# Patient Record
Sex: Male | Born: 1975
Health system: Southern US, Community
[De-identification: ages and names within clinical notes are randomized; demographics above are authoritative.]

## PROBLEM LIST (undated history)

## (undated) DIAGNOSIS — E119 Type 2 diabetes mellitus without complications: Secondary | ICD-10-CM

## (undated) DIAGNOSIS — Z794 Long term (current) use of insulin: Secondary | ICD-10-CM

## (undated) DIAGNOSIS — L97519 Non-pressure chronic ulcer of other part of right foot with unspecified severity: Secondary | ICD-10-CM

## (undated) DIAGNOSIS — E11621 Type 2 diabetes mellitus with foot ulcer: Secondary | ICD-10-CM

## (undated) DIAGNOSIS — D649 Anemia, unspecified: Secondary | ICD-10-CM

## (undated) DIAGNOSIS — I10 Essential (primary) hypertension: Secondary | ICD-10-CM

---
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
## 2017-12-30 HISTORY — PX: FINGER SURGERY: SHX640

## 2018-02-11 DIAGNOSIS — Z6830 Body mass index (BMI) 30.0-30.9, adult: Secondary | ICD-10-CM | POA: Diagnosis not present

## 2018-02-11 DIAGNOSIS — E1165 Type 2 diabetes mellitus with hyperglycemia: Secondary | ICD-10-CM | POA: Diagnosis not present

## 2018-02-11 DIAGNOSIS — E1129 Type 2 diabetes mellitus with other diabetic kidney complication: Secondary | ICD-10-CM | POA: Diagnosis not present

## 2018-02-16 DIAGNOSIS — K580 Irritable bowel syndrome with diarrhea: Secondary | ICD-10-CM | POA: Diagnosis not present

## 2018-02-16 DIAGNOSIS — Z6830 Body mass index (BMI) 30.0-30.9, adult: Secondary | ICD-10-CM | POA: Diagnosis not present

## 2018-03-30 DIAGNOSIS — Z7984 Long term (current) use of oral hypoglycemic drugs: Secondary | ICD-10-CM | POA: Diagnosis not present

## 2018-03-30 DIAGNOSIS — J209 Acute bronchitis, unspecified: Secondary | ICD-10-CM | POA: Diagnosis not present

## 2018-03-30 DIAGNOSIS — J181 Lobar pneumonia, unspecified organism: Secondary | ICD-10-CM | POA: Diagnosis not present

## 2018-03-30 DIAGNOSIS — R42 Dizziness and giddiness: Secondary | ICD-10-CM | POA: Diagnosis not present

## 2018-03-30 DIAGNOSIS — R0600 Dyspnea, unspecified: Secondary | ICD-10-CM | POA: Diagnosis not present

## 2018-03-30 DIAGNOSIS — R000 Tachycardia, unspecified: Secondary | ICD-10-CM | POA: Diagnosis not present

## 2018-03-30 DIAGNOSIS — E119 Type 2 diabetes mellitus without complications: Secondary | ICD-10-CM | POA: Diagnosis not present

## 2018-03-30 DIAGNOSIS — Z6829 Body mass index (BMI) 29.0-29.9, adult: Secondary | ICD-10-CM | POA: Diagnosis not present

## 2018-03-30 DIAGNOSIS — J9801 Acute bronchospasm: Secondary | ICD-10-CM | POA: Diagnosis not present

## 2018-03-30 DIAGNOSIS — J189 Pneumonia, unspecified organism: Secondary | ICD-10-CM | POA: Diagnosis not present

## 2018-04-03 DIAGNOSIS — Z6829 Body mass index (BMI) 29.0-29.9, adult: Secondary | ICD-10-CM | POA: Diagnosis not present

## 2018-04-03 DIAGNOSIS — K219 Gastro-esophageal reflux disease without esophagitis: Secondary | ICD-10-CM | POA: Diagnosis not present

## 2018-04-27 DIAGNOSIS — E1129 Type 2 diabetes mellitus with other diabetic kidney complication: Secondary | ICD-10-CM | POA: Diagnosis not present

## 2018-04-27 DIAGNOSIS — E785 Hyperlipidemia, unspecified: Secondary | ICD-10-CM | POA: Diagnosis not present

## 2018-04-27 DIAGNOSIS — E1165 Type 2 diabetes mellitus with hyperglycemia: Secondary | ICD-10-CM | POA: Diagnosis not present

## 2018-07-08 DIAGNOSIS — E669 Obesity, unspecified: Secondary | ICD-10-CM | POA: Diagnosis not present

## 2018-07-08 DIAGNOSIS — M7989 Other specified soft tissue disorders: Secondary | ICD-10-CM | POA: Diagnosis not present

## 2018-07-08 DIAGNOSIS — R600 Localized edema: Secondary | ICD-10-CM | POA: Diagnosis not present

## 2018-07-08 DIAGNOSIS — M79604 Pain in right leg: Secondary | ICD-10-CM | POA: Diagnosis not present

## 2018-07-08 DIAGNOSIS — M79661 Pain in right lower leg: Secondary | ICD-10-CM | POA: Diagnosis not present

## 2018-07-09 DIAGNOSIS — E1129 Type 2 diabetes mellitus with other diabetic kidney complication: Secondary | ICD-10-CM | POA: Diagnosis not present

## 2018-07-09 DIAGNOSIS — E1165 Type 2 diabetes mellitus with hyperglycemia: Secondary | ICD-10-CM | POA: Diagnosis not present

## 2018-07-09 DIAGNOSIS — L03119 Cellulitis of unspecified part of limb: Secondary | ICD-10-CM | POA: Diagnosis not present

## 2018-07-12 DIAGNOSIS — L0390 Cellulitis, unspecified: Secondary | ICD-10-CM | POA: Diagnosis not present

## 2018-07-12 DIAGNOSIS — S299XXA Unspecified injury of thorax, initial encounter: Secondary | ICD-10-CM | POA: Diagnosis not present

## 2018-07-12 DIAGNOSIS — E1165 Type 2 diabetes mellitus with hyperglycemia: Secondary | ICD-10-CM | POA: Diagnosis not present

## 2018-07-15 DIAGNOSIS — R600 Localized edema: Secondary | ICD-10-CM | POA: Diagnosis not present

## 2018-07-15 DIAGNOSIS — A46 Erysipelas: Secondary | ICD-10-CM | POA: Diagnosis not present

## 2018-07-15 DIAGNOSIS — E669 Obesity, unspecified: Secondary | ICD-10-CM | POA: Diagnosis not present

## 2018-07-15 DIAGNOSIS — L03119 Cellulitis of unspecified part of limb: Secondary | ICD-10-CM | POA: Diagnosis not present

## 2018-07-17 DIAGNOSIS — R234 Changes in skin texture: Secondary | ICD-10-CM | POA: Diagnosis not present

## 2018-07-17 DIAGNOSIS — L03119 Cellulitis of unspecified part of limb: Secondary | ICD-10-CM | POA: Diagnosis not present

## 2018-07-17 DIAGNOSIS — L03115 Cellulitis of right lower limb: Secondary | ICD-10-CM | POA: Diagnosis not present

## 2018-07-17 DIAGNOSIS — E119 Type 2 diabetes mellitus without complications: Secondary | ICD-10-CM | POA: Diagnosis not present

## 2018-07-17 DIAGNOSIS — L02415 Cutaneous abscess of right lower limb: Secondary | ICD-10-CM | POA: Diagnosis not present

## 2018-07-17 DIAGNOSIS — Z6829 Body mass index (BMI) 29.0-29.9, adult: Secondary | ICD-10-CM | POA: Diagnosis not present

## 2018-07-18 DIAGNOSIS — L02415 Cutaneous abscess of right lower limb: Secondary | ICD-10-CM | POA: Diagnosis not present

## 2018-07-18 DIAGNOSIS — L03115 Cellulitis of right lower limb: Secondary | ICD-10-CM | POA: Diagnosis not present

## 2018-07-18 DIAGNOSIS — E119 Type 2 diabetes mellitus without complications: Secondary | ICD-10-CM | POA: Diagnosis not present

## 2018-07-19 DIAGNOSIS — L02415 Cutaneous abscess of right lower limb: Secondary | ICD-10-CM | POA: Diagnosis not present

## 2018-07-19 DIAGNOSIS — E119 Type 2 diabetes mellitus without complications: Secondary | ICD-10-CM | POA: Diagnosis not present

## 2018-07-19 DIAGNOSIS — L03115 Cellulitis of right lower limb: Secondary | ICD-10-CM | POA: Diagnosis not present

## 2018-07-21 DIAGNOSIS — L03115 Cellulitis of right lower limb: Secondary | ICD-10-CM | POA: Diagnosis not present

## 2018-07-21 DIAGNOSIS — E11622 Type 2 diabetes mellitus with other skin ulcer: Secondary | ICD-10-CM | POA: Diagnosis not present

## 2018-07-21 DIAGNOSIS — L97212 Non-pressure chronic ulcer of right calf with fat layer exposed: Secondary | ICD-10-CM | POA: Diagnosis not present

## 2018-07-23 DIAGNOSIS — L03115 Cellulitis of right lower limb: Secondary | ICD-10-CM | POA: Insufficient documentation

## 2018-07-28 DIAGNOSIS — E11622 Type 2 diabetes mellitus with other skin ulcer: Secondary | ICD-10-CM | POA: Diagnosis not present

## 2018-07-28 DIAGNOSIS — L03115 Cellulitis of right lower limb: Secondary | ICD-10-CM | POA: Diagnosis not present

## 2018-07-28 DIAGNOSIS — L97212 Non-pressure chronic ulcer of right calf with fat layer exposed: Secondary | ICD-10-CM | POA: Diagnosis not present

## 2018-07-31 DIAGNOSIS — E1129 Type 2 diabetes mellitus with other diabetic kidney complication: Secondary | ICD-10-CM | POA: Diagnosis not present

## 2018-07-31 DIAGNOSIS — E785 Hyperlipidemia, unspecified: Secondary | ICD-10-CM | POA: Diagnosis not present

## 2018-08-04 DIAGNOSIS — E11622 Type 2 diabetes mellitus with other skin ulcer: Secondary | ICD-10-CM | POA: Diagnosis not present

## 2018-08-04 DIAGNOSIS — L97212 Non-pressure chronic ulcer of right calf with fat layer exposed: Secondary | ICD-10-CM | POA: Diagnosis not present

## 2018-08-04 DIAGNOSIS — E11621 Type 2 diabetes mellitus with foot ulcer: Secondary | ICD-10-CM | POA: Diagnosis not present

## 2018-08-04 DIAGNOSIS — L03115 Cellulitis of right lower limb: Secondary | ICD-10-CM | POA: Diagnosis not present

## 2018-08-07 DIAGNOSIS — L97919 Non-pressure chronic ulcer of unspecified part of right lower leg with unspecified severity: Secondary | ICD-10-CM | POA: Diagnosis not present

## 2018-08-07 DIAGNOSIS — E1129 Type 2 diabetes mellitus with other diabetic kidney complication: Secondary | ICD-10-CM | POA: Diagnosis not present

## 2018-08-07 DIAGNOSIS — E1165 Type 2 diabetes mellitus with hyperglycemia: Secondary | ICD-10-CM | POA: Diagnosis not present

## 2018-08-11 DIAGNOSIS — L97812 Non-pressure chronic ulcer of other part of right lower leg with fat layer exposed: Secondary | ICD-10-CM | POA: Diagnosis not present

## 2018-08-11 DIAGNOSIS — L97212 Non-pressure chronic ulcer of right calf with fat layer exposed: Secondary | ICD-10-CM | POA: Diagnosis not present

## 2018-08-11 DIAGNOSIS — E11622 Type 2 diabetes mellitus with other skin ulcer: Secondary | ICD-10-CM | POA: Diagnosis not present

## 2018-08-12 DIAGNOSIS — L03115 Cellulitis of right lower limb: Secondary | ICD-10-CM | POA: Diagnosis not present

## 2018-08-18 DIAGNOSIS — L97212 Non-pressure chronic ulcer of right calf with fat layer exposed: Secondary | ICD-10-CM | POA: Diagnosis not present

## 2018-08-18 DIAGNOSIS — E11622 Type 2 diabetes mellitus with other skin ulcer: Secondary | ICD-10-CM | POA: Diagnosis not present

## 2018-08-18 DIAGNOSIS — L97812 Non-pressure chronic ulcer of other part of right lower leg with fat layer exposed: Secondary | ICD-10-CM | POA: Diagnosis not present

## 2018-08-21 DIAGNOSIS — I70203 Unspecified atherosclerosis of native arteries of extremities, bilateral legs: Secondary | ICD-10-CM | POA: Diagnosis not present

## 2018-08-21 DIAGNOSIS — L97219 Non-pressure chronic ulcer of right calf with unspecified severity: Secondary | ICD-10-CM | POA: Diagnosis not present

## 2018-08-21 DIAGNOSIS — E11622 Type 2 diabetes mellitus with other skin ulcer: Secondary | ICD-10-CM | POA: Diagnosis not present

## 2018-08-21 DIAGNOSIS — L97919 Non-pressure chronic ulcer of unspecified part of right lower leg with unspecified severity: Secondary | ICD-10-CM | POA: Diagnosis not present

## 2018-09-01 DIAGNOSIS — L03115 Cellulitis of right lower limb: Secondary | ICD-10-CM | POA: Diagnosis not present

## 2018-09-01 DIAGNOSIS — L97212 Non-pressure chronic ulcer of right calf with fat layer exposed: Secondary | ICD-10-CM | POA: Diagnosis not present

## 2018-09-01 DIAGNOSIS — E11622 Type 2 diabetes mellitus with other skin ulcer: Secondary | ICD-10-CM | POA: Diagnosis not present

## 2018-10-30 DIAGNOSIS — E1129 Type 2 diabetes mellitus with other diabetic kidney complication: Secondary | ICD-10-CM | POA: Diagnosis not present

## 2018-10-30 DIAGNOSIS — E785 Hyperlipidemia, unspecified: Secondary | ICD-10-CM | POA: Diagnosis not present

## 2018-11-06 DIAGNOSIS — E1165 Type 2 diabetes mellitus with hyperglycemia: Secondary | ICD-10-CM | POA: Diagnosis not present

## 2018-11-06 DIAGNOSIS — E785 Hyperlipidemia, unspecified: Secondary | ICD-10-CM | POA: Diagnosis not present

## 2018-11-06 DIAGNOSIS — E1129 Type 2 diabetes mellitus with other diabetic kidney complication: Secondary | ICD-10-CM | POA: Diagnosis not present

## 2018-11-25 DIAGNOSIS — E139 Other specified diabetes mellitus without complications: Secondary | ICD-10-CM | POA: Diagnosis not present

## 2018-12-07 DIAGNOSIS — Z6829 Body mass index (BMI) 29.0-29.9, adult: Secondary | ICD-10-CM | POA: Diagnosis not present

## 2018-12-07 DIAGNOSIS — E1165 Type 2 diabetes mellitus with hyperglycemia: Secondary | ICD-10-CM | POA: Diagnosis not present

## 2018-12-07 DIAGNOSIS — E1129 Type 2 diabetes mellitus with other diabetic kidney complication: Secondary | ICD-10-CM | POA: Diagnosis not present

## 2019-02-03 DIAGNOSIS — J209 Acute bronchitis, unspecified: Secondary | ICD-10-CM | POA: Diagnosis not present

## 2019-02-03 DIAGNOSIS — J9801 Acute bronchospasm: Secondary | ICD-10-CM | POA: Diagnosis not present

## 2019-02-04 DIAGNOSIS — S60459A Superficial foreign body of unspecified finger, initial encounter: Secondary | ICD-10-CM | POA: Diagnosis not present

## 2019-02-04 DIAGNOSIS — J209 Acute bronchitis, unspecified: Secondary | ICD-10-CM | POA: Diagnosis not present

## 2019-02-04 DIAGNOSIS — J9801 Acute bronchospasm: Secondary | ICD-10-CM | POA: Diagnosis not present

## 2019-02-04 DIAGNOSIS — L089 Local infection of the skin and subcutaneous tissue, unspecified: Secondary | ICD-10-CM | POA: Diagnosis not present

## 2019-02-05 DIAGNOSIS — R0602 Shortness of breath: Secondary | ICD-10-CM | POA: Diagnosis not present

## 2019-02-05 DIAGNOSIS — Z6829 Body mass index (BMI) 29.0-29.9, adult: Secondary | ICD-10-CM | POA: Diagnosis not present

## 2019-02-05 DIAGNOSIS — M7989 Other specified soft tissue disorders: Secondary | ICD-10-CM | POA: Diagnosis not present

## 2019-02-12 DIAGNOSIS — S60459A Superficial foreign body of unspecified finger, initial encounter: Secondary | ICD-10-CM | POA: Diagnosis not present

## 2019-02-12 DIAGNOSIS — E1129 Type 2 diabetes mellitus with other diabetic kidney complication: Secondary | ICD-10-CM | POA: Diagnosis not present

## 2019-02-12 DIAGNOSIS — L089 Local infection of the skin and subcutaneous tissue, unspecified: Secondary | ICD-10-CM | POA: Diagnosis not present

## 2019-02-12 DIAGNOSIS — L02512 Cutaneous abscess of left hand: Secondary | ICD-10-CM | POA: Diagnosis not present

## 2019-02-23 DIAGNOSIS — L02512 Cutaneous abscess of left hand: Secondary | ICD-10-CM | POA: Diagnosis not present

## 2019-02-23 DIAGNOSIS — Z6828 Body mass index (BMI) 28.0-28.9, adult: Secondary | ICD-10-CM | POA: Diagnosis not present

## 2019-02-26 DIAGNOSIS — L02512 Cutaneous abscess of left hand: Secondary | ICD-10-CM | POA: Diagnosis not present

## 2019-03-01 DIAGNOSIS — E7800 Pure hypercholesterolemia, unspecified: Secondary | ICD-10-CM | POA: Diagnosis not present

## 2019-03-01 DIAGNOSIS — M65842 Other synovitis and tenosynovitis, left hand: Secondary | ICD-10-CM | POA: Diagnosis not present

## 2019-03-01 DIAGNOSIS — L02512 Cutaneous abscess of left hand: Secondary | ICD-10-CM | POA: Diagnosis not present

## 2019-03-01 DIAGNOSIS — E119 Type 2 diabetes mellitus without complications: Secondary | ICD-10-CM | POA: Diagnosis not present

## 2019-03-02 DIAGNOSIS — E1129 Type 2 diabetes mellitus with other diabetic kidney complication: Secondary | ICD-10-CM | POA: Diagnosis not present

## 2019-03-02 DIAGNOSIS — E785 Hyperlipidemia, unspecified: Secondary | ICD-10-CM | POA: Diagnosis not present

## 2019-03-08 DIAGNOSIS — E1129 Type 2 diabetes mellitus with other diabetic kidney complication: Secondary | ICD-10-CM | POA: Diagnosis not present

## 2019-03-08 DIAGNOSIS — E785 Hyperlipidemia, unspecified: Secondary | ICD-10-CM | POA: Diagnosis not present

## 2019-03-08 DIAGNOSIS — E1165 Type 2 diabetes mellitus with hyperglycemia: Secondary | ICD-10-CM | POA: Diagnosis not present

## 2019-04-05 DIAGNOSIS — E113593 Type 2 diabetes mellitus with proliferative diabetic retinopathy without macular edema, bilateral: Secondary | ICD-10-CM | POA: Diagnosis not present

## 2019-04-05 DIAGNOSIS — Z794 Long term (current) use of insulin: Secondary | ICD-10-CM | POA: Diagnosis not present

## 2020-10-15 DIAGNOSIS — E119 Type 2 diabetes mellitus without complications: Secondary | ICD-10-CM | POA: Insufficient documentation

## 2020-10-15 DIAGNOSIS — I10 Essential (primary) hypertension: Secondary | ICD-10-CM | POA: Insufficient documentation

## 2020-10-15 DIAGNOSIS — L0390 Cellulitis, unspecified: Secondary | ICD-10-CM | POA: Insufficient documentation

## 2020-10-30 DIAGNOSIS — I96 Gangrene, not elsewhere classified: Secondary | ICD-10-CM

## 2020-10-30 HISTORY — PX: TOE AMPUTATION: SHX809

## 2020-10-30 HISTORY — DX: Gangrene, not elsewhere classified: I96

## 2020-11-14 DIAGNOSIS — L02611 Cutaneous abscess of right foot: Secondary | ICD-10-CM | POA: Insufficient documentation

## 2020-11-14 DIAGNOSIS — I96 Gangrene, not elsewhere classified: Secondary | ICD-10-CM | POA: Insufficient documentation

## 2020-11-14 DIAGNOSIS — Z1159 Encounter for screening for other viral diseases: Secondary | ICD-10-CM | POA: Insufficient documentation

## 2020-11-14 DIAGNOSIS — L03031 Cellulitis of right toe: Secondary | ICD-10-CM | POA: Insufficient documentation

## 2020-11-22 DIAGNOSIS — M86171 Other acute osteomyelitis, right ankle and foot: Secondary | ICD-10-CM | POA: Insufficient documentation

## 2020-11-22 DIAGNOSIS — Z09 Encounter for follow-up examination after completed treatment for conditions other than malignant neoplasm: Secondary | ICD-10-CM | POA: Insufficient documentation

## 2020-12-04 ENCOUNTER — Other Ambulatory Visit: Payer: Self-pay

## 2020-12-04 ENCOUNTER — Ambulatory Visit: Payer: Commercial Managed Care - PPO | Admitting: Podiatry

## 2020-12-04 ENCOUNTER — Encounter: Payer: Self-pay | Admitting: Podiatry

## 2020-12-04 ENCOUNTER — Ambulatory Visit (INDEPENDENT_AMBULATORY_CARE_PROVIDER_SITE_OTHER): Payer: Commercial Managed Care - PPO

## 2020-12-04 ENCOUNTER — Encounter (HOSPITAL_COMMUNITY): Payer: Self-pay | Admitting: Internal Medicine

## 2020-12-04 ENCOUNTER — Other Ambulatory Visit: Payer: Self-pay | Admitting: *Deleted

## 2020-12-04 ENCOUNTER — Inpatient Hospital Stay (HOSPITAL_COMMUNITY)
Admission: AD | Admit: 2020-12-04 | Discharge: 2020-12-15 | DRG: 240 | Disposition: A | Discharge status: Home Health | Payer: Commercial Managed Care - PPO | Source: Ambulatory Visit | Attending: Internal Medicine | Admitting: Internal Medicine

## 2020-12-04 VITALS — BP 132/77 | HR 96 | Temp 98.1°F | Resp 16

## 2020-12-04 DIAGNOSIS — L0390 Cellulitis, unspecified: Secondary | ICD-10-CM | POA: Diagnosis not present

## 2020-12-04 DIAGNOSIS — Z9889 Other specified postprocedural states: Secondary | ICD-10-CM

## 2020-12-04 DIAGNOSIS — L97519 Non-pressure chronic ulcer of other part of right foot with unspecified severity: Secondary | ICD-10-CM | POA: Diagnosis present

## 2020-12-04 DIAGNOSIS — L03115 Cellulitis of right lower limb: Secondary | ICD-10-CM | POA: Diagnosis present

## 2020-12-04 DIAGNOSIS — D649 Anemia, unspecified: Secondary | ICD-10-CM | POA: Diagnosis not present

## 2020-12-04 DIAGNOSIS — Z7984 Long term (current) use of oral hypoglycemic drugs: Secondary | ICD-10-CM

## 2020-12-04 DIAGNOSIS — E1169 Type 2 diabetes mellitus with other specified complication: Secondary | ICD-10-CM | POA: Diagnosis present

## 2020-12-04 DIAGNOSIS — L97514 Non-pressure chronic ulcer of other part of right foot with necrosis of bone: Secondary | ICD-10-CM

## 2020-12-04 DIAGNOSIS — Z833 Family history of diabetes mellitus: Secondary | ICD-10-CM

## 2020-12-04 DIAGNOSIS — D638 Anemia in other chronic diseases classified elsewhere: Secondary | ICD-10-CM | POA: Diagnosis present

## 2020-12-04 DIAGNOSIS — I96 Gangrene, not elsewhere classified: Secondary | ICD-10-CM

## 2020-12-04 DIAGNOSIS — E11649 Type 2 diabetes mellitus with hypoglycemia without coma: Secondary | ICD-10-CM | POA: Diagnosis not present

## 2020-12-04 DIAGNOSIS — D509 Iron deficiency anemia, unspecified: Secondary | ICD-10-CM | POA: Diagnosis present

## 2020-12-04 DIAGNOSIS — E785 Hyperlipidemia, unspecified: Secondary | ICD-10-CM | POA: Diagnosis present

## 2020-12-04 DIAGNOSIS — E663 Overweight: Secondary | ICD-10-CM | POA: Diagnosis present

## 2020-12-04 DIAGNOSIS — B9620 Unspecified Escherichia coli [E. coli] as the cause of diseases classified elsewhere: Secondary | ICD-10-CM | POA: Diagnosis present

## 2020-12-04 DIAGNOSIS — E871 Hypo-osmolality and hyponatremia: Secondary | ICD-10-CM | POA: Diagnosis not present

## 2020-12-04 DIAGNOSIS — F17210 Nicotine dependence, cigarettes, uncomplicated: Secondary | ICD-10-CM | POA: Diagnosis present

## 2020-12-04 DIAGNOSIS — B3789 Other sites of candidiasis: Secondary | ICD-10-CM | POA: Diagnosis present

## 2020-12-04 DIAGNOSIS — G8918 Other acute postprocedural pain: Secondary | ICD-10-CM

## 2020-12-04 DIAGNOSIS — E11621 Type 2 diabetes mellitus with foot ulcer: Secondary | ICD-10-CM | POA: Diagnosis present

## 2020-12-04 DIAGNOSIS — D72825 Bandemia: Secondary | ICD-10-CM | POA: Diagnosis present

## 2020-12-04 DIAGNOSIS — E1152 Type 2 diabetes mellitus with diabetic peripheral angiopathy with gangrene: Principal | ICD-10-CM | POA: Diagnosis present

## 2020-12-04 DIAGNOSIS — F17200 Nicotine dependence, unspecified, uncomplicated: Secondary | ICD-10-CM | POA: Diagnosis not present

## 2020-12-04 DIAGNOSIS — K5900 Constipation, unspecified: Secondary | ICD-10-CM | POA: Diagnosis not present

## 2020-12-04 DIAGNOSIS — Z794 Long term (current) use of insulin: Secondary | ICD-10-CM

## 2020-12-04 DIAGNOSIS — Z20822 Contact with and (suspected) exposure to covid-19: Secondary | ICD-10-CM | POA: Diagnosis present

## 2020-12-04 DIAGNOSIS — E1165 Type 2 diabetes mellitus with hyperglycemia: Secondary | ICD-10-CM | POA: Diagnosis present

## 2020-12-04 DIAGNOSIS — Z79899 Other long term (current) drug therapy: Secondary | ICD-10-CM

## 2020-12-04 DIAGNOSIS — I739 Peripheral vascular disease, unspecified: Secondary | ICD-10-CM | POA: Diagnosis not present

## 2020-12-04 DIAGNOSIS — I998 Other disorder of circulatory system: Secondary | ICD-10-CM

## 2020-12-04 DIAGNOSIS — L02611 Cutaneous abscess of right foot: Secondary | ICD-10-CM | POA: Diagnosis present

## 2020-12-04 DIAGNOSIS — Z6827 Body mass index (BMI) 27.0-27.9, adult: Secondary | ICD-10-CM | POA: Diagnosis not present

## 2020-12-04 DIAGNOSIS — M86171 Other acute osteomyelitis, right ankle and foot: Secondary | ICD-10-CM | POA: Diagnosis present

## 2020-12-04 DIAGNOSIS — B9789 Other viral agents as the cause of diseases classified elsewhere: Secondary | ICD-10-CM | POA: Diagnosis not present

## 2020-12-04 DIAGNOSIS — B9689 Other specified bacterial agents as the cause of diseases classified elsewhere: Secondary | ICD-10-CM | POA: Diagnosis not present

## 2020-12-04 DIAGNOSIS — I10 Essential (primary) hypertension: Secondary | ICD-10-CM | POA: Diagnosis present

## 2020-12-04 DIAGNOSIS — M8580 Other specified disorders of bone density and structure, unspecified site: Secondary | ICD-10-CM

## 2020-12-04 DIAGNOSIS — E119 Type 2 diabetes mellitus without complications: Secondary | ICD-10-CM

## 2020-12-04 HISTORY — DX: Essential (primary) hypertension: I10

## 2020-12-04 MED ORDER — DOXYCYCLINE HYCLATE 100 MG PO TABS: 100.0000 mg | ORAL_TABLET | Freq: Two times a day (BID) | ORAL | 0 refills | Status: DC

## 2020-12-04 NOTE — Progress Notes (Signed)
  Subjective:  Patient ID: Benjamin Russo, male    DOB: Apr 09, 1976,  MRN: [id number]  Chief Complaint  Patient presents with  . Foot Ulcer    i peeled a piece of skin off the foot and then 3 days later it got infected and i had to have surgery done on it by Dr Cherlynn Perches   44 y.o. male presents with the above complaint. History confirmed with patient.  Underwent surgery with Dr. Longer over 2 weeks ago for right fourth toe metatarsal amputation.  States that since surgery his skin has changed color gotten darker including the fifth toe states he took off a piece of skin and after that it was infected for 3 days.  Has been having chills at night.  Patient states he has not had any vascular workup.  Objective:  Physical Exam:  Right Foot: Palpable PT, faint DP. Gangrene of 5th toe right, deep wound 4th interspace with fibronecrotic base. Surrounding cellulitis. No purulence expressible. Right foot edema noted. No crepitus or fluctuance.  No images are attached to the encounter.  Vitals:   12/04/20 1151  BP: 132/77  Pulse: 96  Resp: 16  Temp: 98.1 F (36.7 C)    Radiographs: X-ray of the right foot: soft tissue swelling and osteolysis of the 4th metatarsal Assessment:   1. 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 Gangrene of right foot (HCC)   3. PAD (peripheral artery disease) (HCC)   4. Vascular calcification   5. Bone erosion determined by x-ray    Plan:  Patient was evaluated and treated and all questions answered.  Gangrene, Osteomyelitis right foot -XR reviewed with patient -Dressing applied consisting of sterile gauze and kerlix -Offload ulcer with CAM boot -CAM boot dispensed  -Discussed with patient he will benefit from admission to the hospital for IV abx, vascular workup, surgery. At minimum he will need 5th toe amputation, more likely will need 4th/5th ray resections. Will follow while admitted. -Arranged for direct admit. Dr. Katrinka Blazing to accept. Bed  request placed. Patient may not get bed today, I think he is stable. Sent PO doxy until he can get admitted.  No follow-ups on file.

## 2020-12-05 ENCOUNTER — Encounter (HOSPITAL_COMMUNITY): Payer: Self-pay | Admitting: Internal Medicine

## 2020-12-05 ENCOUNTER — Encounter (HOSPITAL_COMMUNITY): Payer: Commercial Managed Care - PPO

## 2020-12-05 DIAGNOSIS — M86171 Other acute osteomyelitis, right ankle and foot: Secondary | ICD-10-CM

## 2020-12-05 DIAGNOSIS — L03115 Cellulitis of right lower limb: Secondary | ICD-10-CM | POA: Diagnosis present

## 2020-12-05 LAB — SURGICAL PCR SCREEN
MRSA, PCR: NEGATIVE
Staphylococcus aureus: NEGATIVE

## 2020-12-05 LAB — COMPREHENSIVE METABOLIC PANEL
ALT: 26 U/L (ref 0–44)
AST: 19 U/L (ref 15–41)
Albumin: 2.7 g/dL — ABNORMAL LOW (ref 3.5–5.0)
Alkaline Phosphatase: 79 U/L (ref 38–126)
Anion gap: 9 (ref 5–15)
BUN: 23 mg/dL — ABNORMAL HIGH (ref 6–20)
CO2: 22 mmol/L (ref 22–32)
Calcium: 8.9 mg/dL (ref 8.9–10.3)
Chloride: 104 mmol/L (ref 98–111)
Creatinine, Ser: 1.2 mg/dL (ref 0.61–1.24)
GFR, Estimated: >60 mL/min (ref >60)
Glucose, Bld: 172 mg/dL — ABNORMAL HIGH (ref 70–99)
Potassium: 4.6 mmol/L (ref 3.5–5.1)
Sodium: 135 mmol/L (ref 135–145)
Total Bilirubin: 0.7 mg/dL (ref 0.3–1.2)
Total Protein: 7.1 g/dL (ref 6.5–8.1)

## 2020-12-05 LAB — GLUCOSE, CAPILLARY
Glucose-Capillary: 172 mg/dL — ABNORMAL HIGH (ref 70–99)
Glucose-Capillary: 112 mg/dL — ABNORMAL HIGH (ref 70–99)
Glucose-Capillary: 113 mg/dL — ABNORMAL HIGH (ref 70–99)
Glucose-Capillary: 145 mg/dL — ABNORMAL HIGH (ref 70–99)
Glucose-Capillary: 134 mg/dL — ABNORMAL HIGH (ref 70–99)

## 2020-12-05 LAB — CBC WITH DIFFERENTIAL/PLATELET
Abs Immature Granulocytes: 0.04 10*3/uL (ref 0.00–0.07)
Basophils Absolute: 0 10*3/uL (ref 0.0–0.1)
Basophils Relative: 0 %
Eosinophils Absolute: 0.1 10*3/uL (ref 0.0–0.5)
Eosinophils Relative: 1 %
HCT: 25.5 % — ABNORMAL LOW (ref 39.0–52.0)
Hemoglobin: 8.7 g/dL — ABNORMAL LOW (ref 13.0–17.0)
Immature Granulocytes: 0 %
Lymphocytes Relative: 22 %
Lymphs Abs: 2.2 10*3/uL (ref 0.7–4.0)
MCH: 25.1 pg — ABNORMAL LOW (ref 26.0–34.0)
MCHC: 34.1 g/dL (ref 30.0–36.0)
MCV: 73.5 fL — ABNORMAL LOW (ref 80.0–100.0)
Monocytes Absolute: 1.1 10*3/uL — ABNORMAL HIGH (ref 0.1–1.0)
Monocytes Relative: 10 %
Neutro Abs: 6.9 10*3/uL (ref 1.7–7.7)
Neutrophils Relative %: 67 %
Platelets: 323 10*3/uL (ref 150–400)
RBC: 3.47 MIL/uL — ABNORMAL LOW (ref 4.22–5.81)
RDW: 13.5 % (ref 11.5–15.5)
WBC: 10.3 10*3/uL (ref 4.0–10.5)
nRBC: 0 % (ref 0.0–0.2)

## 2020-12-05 LAB — TYPE AND SCREEN
ABO/RH(D): O POS
Antibody Screen: NEGATIVE

## 2020-12-05 LAB — SEDIMENTATION RATE: Sed Rate: 101 mm/hr — ABNORMAL HIGH (ref 0–16)

## 2020-12-05 LAB — ABO/RH: ABO/RH(D): O POS

## 2020-12-05 LAB — HIV ANTIBODY (ROUTINE TESTING W REFLEX): HIV Screen 4th Generation wRfx: NONREACTIVE

## 2020-12-05 MED ORDER — SODIUM CHLORIDE 0.9 % IV SOLN
2.0000 g | Freq: Three times a day (TID) | INTRAVENOUS | Status: DC
  Administered 2020-12-05 – 2020-12-13 (×25): 2 g via INTRAVENOUS
  Filled 2020-12-05 (×25): qty 2

## 2020-12-05 MED ORDER — ACETAMINOPHEN 650 MG RE SUPP: 650.0000 mg | Freq: Four times a day (QID) | RECTAL | Status: DC | PRN

## 2020-12-05 MED ORDER — SODIUM CHLORIDE 0.9 % IV SOLN: INTRAVENOUS | Status: AC

## 2020-12-05 MED ORDER — VANCOMYCIN HCL IN DEXTROSE 1-5 GM/200ML-% IV SOLN
1000.0000 mg | Freq: Three times a day (TID) | INTRAVENOUS | Status: DC
  Administered 2020-12-05 – 2020-12-07 (×7): 1000 mg via INTRAVENOUS
  Filled 2020-12-05 (×7): qty 200

## 2020-12-05 MED ORDER — ACETAMINOPHEN 325 MG PO TABS
650.0000 mg | ORAL_TABLET | Freq: Four times a day (QID) | ORAL | Status: DC | PRN
  Administered 2020-12-08 (×2): 650 mg via ORAL
  Filled 2020-12-05 (×2): qty 2

## 2020-12-05 MED ORDER — INSULIN ASPART 100 UNIT/ML ~~LOC~~ SOLN
0.0000 [IU] | SUBCUTANEOUS | Status: DC
  Administered 2020-12-05: 1 [IU] via SUBCUTANEOUS
  Administered 2020-12-05: 2 [IU] via SUBCUTANEOUS
  Administered 2020-12-05: 22:00:00 1 [IU] via SUBCUTANEOUS
  Administered 2020-12-06: 21:00:00 2 [IU] via SUBCUTANEOUS
  Administered 2020-12-06 (×2): 1 [IU] via SUBCUTANEOUS
  Administered 2020-12-06 – 2020-12-07 (×4): 2 [IU] via SUBCUTANEOUS
  Administered 2020-12-07: 1 [IU] via SUBCUTANEOUS
  Administered 2020-12-08: 9 [IU] via SUBCUTANEOUS

## 2020-12-05 MED ORDER — METOPROLOL SUCCINATE ER 25 MG PO TB24
25.0000 mg | ORAL_TABLET | Freq: Every day | ORAL | Status: DC
  Administered 2020-12-05 – 2020-12-15 (×11): 25 mg via ORAL
  Filled 2020-12-05 (×11): qty 1

## 2020-12-05 MED ORDER — INSULIN GLARGINE 100 UNIT/ML ~~LOC~~ SOLN
10.0000 [IU] | Freq: Every day | SUBCUTANEOUS | Status: DC
  Administered 2020-12-05 – 2020-12-07 (×4): 10 [IU] via SUBCUTANEOUS
  Filled 2020-12-05 (×5): qty 0.1

## 2020-12-05 MED ORDER — VANCOMYCIN HCL 1500 MG/300ML IV SOLN
1500.0000 mg | Freq: Once | INTRAVENOUS | Status: AC
  Administered 2020-12-05: 1500 mg via INTRAVENOUS
  Filled 2020-12-05: qty 300

## 2020-12-05 MED ORDER — SODIUM CHLORIDE 0.9 % IV SOLN
2.0000 g | Freq: Once | INTRAVENOUS | Status: AC
  Administered 2020-12-05: 2 g via INTRAVENOUS
  Filled 2020-12-05: qty 2

## 2020-12-05 NOTE — H&P (Addendum)
History and Physical    Benjamin Russo JME:[id number] DOB: February 07, 1976 DOA: 12/04/2020  PCP: Charlott Rakes, MD  Patient coming from: Patient was referred by Dr. [name] podiatrist for direct admit.  Chief Complaint: Right foot osteomyelitis gangrene.  HPI: Benjamin Russo is a 44 y.o. male with history of diabetes mellitus type 2 for the last 20 years takes insulin with history of hypertension tobacco abuse had amputation of his fourth toe about 2 weeks ago for gangrenous looking toe.  About a week later patient noticed discoloration in the fifth toe and also some discharge.  Patient was referred to Dr. [name] and was referred to hospital for admission.  As per the Dr. Kandice Hams note patient had x-rays done which showed features concerning for osteomyelitis in addition also had features of peripheral artery disease and gangrenous fifth toe.  Patient also last few days has been a subjective feeling of fever chills increasing discharge from the right foot.  Also noticed increasing swelling.  On my exam patient is not in distress.  Patient's dorsalis pedis pulses are dopplerable.  Has good sensation and is able to move his ankle.  Has ulcer and discharge from the amputated right foot fourth toe area.  The small toe on the right foot looks dark.  ED Course: Patient is a direct admit.  Review of Systems: As per HPI, rest all negative.   Past Medical History:  Diagnosis Date  . Diabetes mellitus without complication (HCC)   . Hypertension     Past Surgical History:  Procedure Laterality Date  . FINGER SURGERY Left 2019     reports that he has been smoking cigarettes. He has been smoking about 0.25 packs per day. He has never used smokeless tobacco. He reports current alcohol use. He reports that he does not use drugs.  No Known Allergies  Family History  Problem Relation Age of Onset  . Diabetes Mellitus II Father   . Diabetes Mellitus II Brother     Prior to Admission medications    Medication Sig Start Date End Date Taking? Authorizing Provider  albuterol (VENTOLIN HFA) 108 (90 Base) MCG/ACT inhaler  03/31/18   [provider]  amoxicillin-clavulanate (AUGMENTIN) 875-125 MG tablet Take 1 tablet by mouth 2 (two) times daily. 11/13/20   [provider]  atorvastatin (LIPITOR) 10 MG tablet Take by mouth.    [provider]  BD PEN NEEDLE NANO 2ND GEN 32G X 4 MM MISC SMARTSIG:Injection As Directed 10/10/20   [provider]  Continuous Blood Gluc Sensor (FREESTYLE LIBRE 2 SENSOR) MISC See admin instructions. 11/17/20   [provider]  diclofenac (VOLTAREN) 75 MG EC tablet TK 1 T PO BID PRN P 07/12/18   [provider]  doxycycline (VIBRA-TABS) 100 MG tablet Take 1 tablet (100 mg total) by mouth 2 (two) times daily. 12/04/20   Park Liter, DPM  insulin glargine (LANTUS SOLOSTAR) 100 UNIT/ML Solostar Pen  10/24/20   [provider]  Insulin Glargine-Lixisenatide (SOLIQUA) 100-33 UNT-MCG/ML SOPN 19 UNITS [place] D 07/09/18   [provider]  lisinopril (ZESTRIL) 20 MG tablet Take by mouth. 10/10/20   [provider]  metFORMIN (GLUCOPHAGE) 500 MG tablet Take by mouth. 10/10/20   [provider]  metoprolol succinate (TOPROL-XL) 25 MG 24 hr tablet Take 25 mg by mouth daily. 11/14/20   [provider]  ondansetron (ZOFRAN-ODT) 4 MG disintegrating tablet DISSOLVE 1 TABLET ON THE TONGUE EVERY 6 HOURS AS NEEDED FOR NAUSEA  OR VOMITING 11/17/20   [provider]  oxyCODONE (OXY IR/ROXICODONE) 5 MG immediate release tablet Take by mouth. 11/17/20   [provider]  traMADol (ULTRAM) 50 MG tablet TK 1 T PO Q 6 H PRN P 07/12/18   [provider]    Physical Exam: Constitutional: Moderately built and nourished. Vitals:   12/04/20 2111  BP: 137/80  Pulse: 88  Resp: 17  Temp: 98.3 F (36.8 C)  TempSrc: Oral  SpO2: 99%  Weight: 87.3 kg  Height: 5\' 10"  (1.778 m)    Eyes: Anicteric no pallor. ENMT: No discharge from the ears eyes nose or mouth. Neck: No mass felt.  No neck rigidity. Respiratory: No rhonchi or crepitations. Cardiovascular: S1-S2 heard. Abdomen: Soft nontender bowel sounds present. Musculoskeletal: Swelling of the right foot with discharge from the amputated area of the right foot fourth toe.  Gangrenous looking right foot fifth toe. Skin: Erythema and swelling of the right foot with erythema and tenderness looking fifth toe. Neurologic: Alert awake oriented to time place and person.  Moves all extremities. Psychiatric: Appears normal.  Normal affect.   Labs on Admission: I have personally reviewed following labs and imaging studies  CBC: No results for input(s): WBC, NEUTROABS, HGB, HCT, MCV, PLT in the last 168 hours. Basic Metabolic Panel: No results for input(s): NA, K, CL, CO2, GLUCOSE, BUN, CREATININE, CALCIUM, MG, PHOS in the last 168 hours. GFR: CrCl cannot be calculated (No successful lab value found.). Liver Function Tests: No results for input(s): AST, ALT, ALKPHOS, BILITOT, PROT, ALBUMIN in the last 168 hours. No results for input(s): LIPASE, AMYLASE in the last 168 hours. No results for input(s): AMMONIA in the last 168 hours. Coagulation Profile: No results for input(s): INR, PROTIME in the last 168 hours. Cardiac Enzymes: No results for input(s): CKTOTAL, CKMB, CKMBINDEX, TROPONINI in the last 168 hours. BNP (last 3 results) No results for input(s): PROBNP in the last 8760 hours. HbA1C: No results for input(s): HGBA1C in the last 72 hours. CBG: No results for input(s): GLUCAP in the last 168 hours. Lipid Profile: No results for input(s): CHOL, HDL, LDLCALC, TRIG, CHOLHDL, LDLDIRECT in the last 72 hours. Thyroid Function Tests: No results for input(s): TSH, T4TOTAL, FREET4, T3FREE, THYROIDAB in the last 72 hours. Anemia Panel: No results for input(s): VITAMINB12, FOLATE, FERRITIN, TIBC, IRON, RETICCTPCT in  the last 72 hours. Urine analysis: No results found for: COLORURINE, APPEARANCEUR, LABSPEC, PHURINE, GLUCOSEU, HGBUR, BILIRUBINUR, KETONESUR, PROTEINUR, UROBILINOGEN, NITRITE, LEUKOCYTESUR Sepsis Labs: @LABRCNTIP (procalcitonin:4,lacticidven:4) )No results found for this or any previous visit (from the past 240 hour(s)).   Radiological Exams on Admission: DG Foot Complete Right  Result Date: 12/04/2020 Please see detailed radiograph report in office note.    Assessment/Plan Principal Problem:   Acute osteomyelitis of toe, right (HCC) Active Problems:   Gangrene (HCC)   Hypertension   Type 2 diabetes mellitus (HCC)   Cellulitis of right foot    1. Cellulitis with possible osteomyelitis of the right foot with gangrenous looking right foot sixth toe but has dopplerable pulses on the dorsalis pedis.  We will keep patient n.p.o. except medications and antibiotics get cultures await Dr. patient podiatrist further recommendation likely may go for surgery.  Check ABI.  Will need vascular surgery input eventually. 2. Diabetes mellitus type 2 takes Lantus insulin 80 units I have dosed 10 units for now in anticipation of procedure in the morning for which patient will be kept n.p.o.  CBG every 4 hourly while  n.p.o. 3. Hypertension we will continue metoprolol hold lisinopril for now.  As needed IV hydralazine. 4. History of anemia follow CBC. 5. Tobacco abuse advised about quitting.  Since patient has osteomyelitis and cellulitis with gangrenous-looking toe will need further work-up and inpatient status.  Covid test is pending.  All labs are pending.   DVT prophylaxis: SCDs for now in anticipation of procedure. Code Status: Full code. Family Communication: Discussed with patient. Disposition Plan: Home when stable. Consults called: None. Admission status: Inpatient.   Eduard Clos MD [facility] Pager [phone].  If 7PM-7AM, please contact  night-coverage [email or web address] Password [email or web address]  12/05/2020, 1:42 AM

## 2020-12-05 NOTE — Progress Notes (Signed)
Pharmacy Antibiotic Note  Benjamin Russo is a 44 y.o. male admitted on 12/04/2020 with RLE gangrene/osteomyelitis.  Pharmacy has been consulted for Vancomycin dosing.  Plan: Vancomycin 1500 mg IV x 1, then Vancomycin 1000 mg IV q8h Cefepime 2 g IV q8h   Height: 5\' 10"  (177.8 cm) Weight: 87.3 kg (192 lb 7.4 oz) IBW/kg (Calculated) : 73  Temp (24hrs), Avg:98.2 F (36.8 C), Min:98 F (36.7 C), Max:98.3 F (36.8 C)  Recent Labs  Lab 12/05/20 0425  WBC 10.3  CREATININE 1.20    Estimated Creatinine Clearance: 82.8 mL/min (by C-G formula based on SCr of 1.2 mg/dL).    No Known Allergies    14/07/21 12/05/2020 5:22 AM

## 2020-12-05 NOTE — Consult Note (Signed)
Reason for Consult: Gangrene, infection Referring Physician: DR. Candelaria Stagers, MD  Benjamin Russo is an 44 y.o. male.  HPI: 44 year old male was admitted to the hospital as a direct admit from Dr. [name].  He was seen in the office yesterday and found a gangrenous changes to the fifth toe.  The patient had a partial fourth ray amputation with concerns of possible osteomyelitis of the residual metatarsal.  Patient states the wound is been ongoing last couple weeks.  He previously had surgery on November 17, 2020 at [facility].  He was then referred to Dr. [name] for limb salvage.  Given the concern he was admitted to the hospital as a direct admit.  He has not had vascular studies and has not seen vascular surgery.  Past Medical History:  Diagnosis Date  . Diabetes mellitus without complication (HCC)   . Hypertension     Past Surgical History:  Procedure Laterality Date  . FINGER SURGERY Left 2019    Family History  Problem Relation Age of Onset  . Diabetes Mellitus II Father   . Diabetes Mellitus II Brother     Social History:  reports that he has been smoking cigarettes. He has been smoking about 0.25 packs per day. He has never used smokeless tobacco. He reports current alcohol use. He reports that he does not use drugs.  Allergies: No Known Allergies  Medications: I have reviewed the patient's current medications.  Results for orders placed or performed during the hospital encounter of 12/04/20 (from the past 48 hour(s))  Glucose, capillary     Status: Abnormal   Collection Time: 12/05/20  3:52 AM  Result Value Ref Range   Glucose-Capillary 172 (H) 70 - 99 mg/dL    Comment: Glucose reference range applies only to samples taken after fasting for at least 8 hours.  Comprehensive metabolic panel     Status: Abnormal   Collection Time: 12/05/20  4:25 AM  Result Value Ref Range   Sodium 135 135 - 145 mmol/L   Potassium 4.6 3.5 - 5.1 mmol/L   Chloride 104 98 - 111 mmol/L   CO2 22  22 - 32 mmol/L   Glucose, Bld 172 (H) 70 - 99 mg/dL    Comment: Glucose reference range applies only to samples taken after fasting for at least 8 hours.   BUN 23 (H) 6 - 20 mg/dL   Creatinine, Ser 5.46 0.61 - 1.24 mg/dL   Calcium 8.9 8.9 - 56.8 mg/dL   Total Protein 7.1 6.5 - 8.1 g/dL   Albumin 2.7 (L) 3.5 - 5.0 g/dL   AST 19 15 - 41 U/L   ALT 26 0 - 44 U/L   Alkaline Phosphatase 79 38 - 126 U/L   Total Bilirubin 0.7 0.3 - 1.2 mg/dL   GFR, Estimated >12 >75 mL/min    Comment: (NOTE) Calculated using the CKD-EPI Creatinine Equation (2021)    Anion gap 9 5 - 15    Comment: Performed at [facility], [address], [place], [place] [address]  CBC with Differential/Platelet     Status: Abnormal   Collection Time: 12/05/20  4:25 AM  Result Value Ref Range   WBC 10.3 4.0 - 10.5 K/uL   RBC 3.47 (L) 4.22 - 5.81 MIL/uL   Hemoglobin 8.7 (L) 13.0 - 17.0 g/dL    Comment: Reticulocyte Hemoglobin testing may be clinically indicated, consider ordering this additional test [id number]    HCT 25.5 (L) 39 - 52 %  MCV 73.5 (L) 80.0 - 100.0 fL   MCH 25.1 (L) 26.0 - 34.0 pg   MCHC 34.1 30.0 - 36.0 g/dL   RDW 60.4 54.0 - 98.1 %   Platelets 323 150 - 400 K/uL   nRBC 0.0 0.0 - 0.2 %   Neutrophils Relative % 67 %   Neutro Abs 6.9 1.7 - 7.7 K/uL   Lymphocytes Relative 22 %   Lymphs Abs 2.2 0.7 - 4.0 K/uL   Monocytes Relative 10 %   Monocytes Absolute 1.1 (H) 0.1 - 1.0 K/uL   Eosinophils Relative 1 %   Eosinophils Absolute 0.1 0.0 - 0.5 K/uL   Basophils Relative 0 %   Basophils Absolute 0.0 0.0 - 0.1 K/uL   Immature Granulocytes 0 %   Abs Immature Granulocytes 0.04 0.00 - 0.07 K/uL    Comment: Performed at [facility], [address], [place], [place] [address]  Sedimentation rate     Status: Abnormal   Collection Time: 12/05/20  4:25 AM  Result Value Ref Range   Sed Rate 101 (H) 0 - 16 mm/hr    Comment: Performed at [facility], [address], [place], [place]  [address]  Type and screen [facility]     Status: None   Collection Time: 12/05/20  4:25 AM  Result Value Ref Range   ABO/RH(D) O POS    Antibody Screen NEG    Sample Expiration      12/08/2020,2359 Performed at [facility], [address] [place], [place] [address]   HIV Antibody (routine testing w rflx)     Status: None   Collection Time: 12/05/20  4:25 AM  Result Value Ref Range   HIV Screen 4th Generation wRfx Non Reactive Non Reactive    Comment: Performed at [facility], [address], [place], [place] [address]  Surgical PCR screen     Status: None   Collection Time: 12/05/20  5:25 AM   Specimen: Nasal Mucosa; Nasal Swab  Result Value Ref Range   MRSA, PCR NEGATIVE NEGATIVE   Staphylococcus aureus NEGATIVE NEGATIVE    Comment: (NOTE) The Xpert SA Assay (FDA approved for NASAL specimens in patients 47 years of age and older), is one component of a comprehensive surveillance program. It is not intended to diagnose infection nor to guide or monitor treatment. Performed at [facility], [address], [place], [place] [address]   ABO/Rh     Status: None   Collection Time: 12/05/20  6:20 AM  Result Value Ref Range   ABO/RH(D)      O POS Performed at [facility], [address] [place] [address], [place], [place] [address]   Glucose, capillary     Status: Abnormal   Collection Time: 12/05/20  8:14 AM  Result Value Ref Range   Glucose-Capillary 112 (H) 70 - 99 mg/dL    Comment: Glucose reference range applies only to samples taken after fasting for at least 8 hours.  Glucose, capillary     Status: Abnormal   Collection Time: 12/05/20 11:15 AM  Result Value Ref Range   Glucose-Capillary 113 (H) 70 - 99 mg/dL    Comment: Glucose reference range applies only to samples taken after fasting for at least 8 hours.    DG Foot Complete Right  Result Date: 12/04/2020 Please see detailed radiograph report in office note.   Review of Systems Blood  pressure 136/79, pulse 91, temperature 98.2  F (36.8 C), temperature source Oral, resp. rate 18, height 5\' 10"  (1.778 m), weight 87.3 kg, SpO2 99 %. Physical Exam General: NAD  Dermatological: Fourth toe amputation noted with a wound that probes to bone.  Gangrenous changes present the fifth toe.  Edema of the foot with localized erythema.  Mild warmth of the foot.  There is no purulence identified there is no fluctuation or crepitation.        Vascular: Unable to palpate pulses.  Neruologic: Sensation decreased.  Musculoskeletal: No pain on exam  Assessment/Plan: 44 year old male with gangrene right foot, osteomyelitis   X-rays were concerning for possible osteomyelitis.  MRI has been ordered.  Arterial studies are also ordered.  I discussed with him he is at high risk of limb loss.  Would recommend vascular surgery consultation as well.  And at minimum will need a transmetatarsal amputation of the fourth and fifth rays.  Dressing was reapplied today.  We will continue to follow.  45 12/05/2020, 1:22 PM

## 2020-12-05 NOTE — Plan of Care (Signed)

## 2020-12-05 NOTE — Progress Notes (Signed)
PROGRESS NOTE  Benjamin Russo FXT:[id number] DOB: 1976/08/07   PCP: Charlott Rakes, MD  Patient is from: Home  DOA: 12/04/2020 LOS: 1  Chief complaints: Right foot infection and wound  Brief Narrative / Interim history: 44 year old male with history of IDDM-2, tobacco use, HTN, anemia and recent right fourth toe amputation for possible gangrene about 2 weeks ago admitted from podiatry office, Dr. [name] with concern for osteomyelitis and gangrene in right fifth toe and PAD.  Patient has had discoloration and discharge from fifth toe for about a week.  Also subjective fever, chills and night sweats intermittently.  Per podiatry note, had x-ray concerning for bone erosion.  He was started on vancomycin and cefepime, and admitted.  Subjective: Seen and examined earlier this morning.  No major events overnight or this morning.  He is nervous about amputation of his foot.  Pain fairly controlled.  Denies melena or hematochezia.  Objective: Vitals:   12/04/20 2111 12/05/20 0100 12/05/20 0500 12/05/20 0748  BP: 137/80 135/76 131/78 136/79  Pulse: 88 82 85 91  Resp: 17 17 16 18   Temp: 98.3 F (36.8 C) 98 F (36.7 C) 98.2 F (36.8 C)   TempSrc: Oral Oral Oral   SpO2: 99% 99% 98% 99%  Weight: 87.3 kg     Height: 5\' 10"  (1.778 m)       Intake/Output Summary (Last 24 hours) at 12/05/2020 1155 Last data filed at 12/05/2020 0600 Gross per 24 hour  Intake 895.91 ml  Output 825 ml  Net 70.91 ml   Filed Weights   12/04/20 2111  Weight: 87.3 kg    Examination:  GENERAL: No apparent distress.  Nontoxic. HEENT: MMM.  Vision and hearing grossly intact.  NECK: Supple.  No apparent JVD.  RESP: On RA.  No IWOB.  Fair aeration bilaterally. CVS:  RRR. Heart sounds normal.  ABD/GI/GU: BS+. Abd soft, NTND.  MSK/EXT:  Moves extremities. No apparent deformity.  Dressing over right foot to his ankle DCI. SKIN: no apparent skin lesion or wound NEURO: Awake, alert and oriented appropriately.   No apparent focal neuro deficit. PSYCH: Calm. Normal affect.  Procedures:  None  Microbiology summarized: COVID-19 and influenza PCR pending. Blood cultures pending. MRSA PCR nonreactive.  Assessment & Plan: Cellulitis with possible osteomyelitis and gangrene of right fifth toe Recent right first toe amputation for possible gangrene -Follow ABI.  May need vascular surgery consult after ABI result -Continue broad-spectrum antibiotic with vancomycin and cefepime -Follow podiatry recommendation -Follow cultures  Uncontrolled IDDM-2 with hyperglycemia: No A1c in his chart of care everywhere.  Seems to be on Lantus 80 units and Metformin at home but med rec not completed Recent Labs  Lab 12/05/20 0352 12/05/20 0814 12/05/20 1115  GLUCAP 172* 112* 113*  -Lantus 20 units twice daily.  Was on 10 units nightly while n.p.o. -Increase SSI to moderate -Continue statin -Check hemoglobin A1c and lipid panel  Essential hypertension: Normotensive. -Continue home medications-lisinopril and metoprolol  Microcytic anemia: Baseline Hgb 11-12>> 8.7.  Denies melena or hematochezia. -Check anemia panel-may not be reliable in the setting of infection but could be helpful -Continue monitoring  Tobacco use disorder: Reports smoking about 2 cigarettes a day. -Encouraged cessation. -Nicotine patch as needed    Body mass index is 27.62 kg/m.         DVT prophylaxis:  SCDs Start: 12/05/20 0140  Code Status: Full code Family Communication: Patient and/or RN. Available if any question.  Status is: Inpatient  Remains inpatient  appropriate because:Ongoing diagnostic testing needed not appropriate for outpatient work up, IV treatments appropriate due to intensity of illness or inability to take PO and Inpatient level of care appropriate due to severity of illness   Dispo: The patient is from: Home              Anticipated d/c is to: Home              Anticipated d/c date is: 3 days               Patient currently is not medically stable to d/c.       Consultants:  Podiatry   Sch Meds:  Scheduled Meds: . insulin aspart  0-9 Units Subcutaneous Q4H  . insulin glargine  10 Units Subcutaneous QHS  . metoprolol succinate  25 mg Oral Daily   Continuous Infusions: . sodium chloride 75 mL/hr at 12/05/20 0243  . ceFEPime (MAXIPIME) IV 2 g (12/05/20 0846)  . vancomycin 1,000 mg (12/05/20 0844)   PRN Meds:.acetaminophen **OR** acetaminophen  Antimicrobials: Anti-infectives (From admission, onward)   Start     Dose/Rate Route Frequency Ordered Stop   12/05/20 1000  vancomycin (VANCOCIN) IVPB 1000 mg/200 mL premix        1,000 mg 200 mL/hr over 60 Minutes Intravenous Every 8 hours 12/05/20 0525     12/05/20 1000  ceFEPIme (MAXIPIME) 2 g in sodium chloride 0.9 % 100 mL IVPB        2 g 200 mL/hr over 30 Minutes Intravenous Every 8 hours 12/05/20 0525     12/05/20 0230  vancomycin (VANCOREADY) IVPB 1500 mg/300 mL        1,500 mg 150 mL/hr over 120 Minutes Intravenous  Once 12/05/20 0142 12/05/20 0447   12/05/20 0230  ceFEPIme (MAXIPIME) 2 g in sodium chloride 0.9 % 100 mL IVPB        2 g 200 mL/hr over 30 Minutes Intravenous  Once 12/05/20 0142 12/05/20 0314       I have personally reviewed the following labs and images: CBC: Recent Labs  Lab 12/05/20 0425  WBC 10.3  NEUTROABS 6.9  HGB 8.7*  HCT 25.5*  MCV 73.5*  PLT 323   BMP &GFR Recent Labs  Lab 12/05/20 0425  NA 135  K 4.6  CL 104  CO2 22  GLUCOSE 172*  BUN 23*  CREATININE 1.20  CALCIUM 8.9   Estimated Creatinine Clearance: 82.8 mL/min (by C-G formula based on SCr of 1.2 mg/dL). Liver & Pancreas: Recent Labs  Lab 12/05/20 0425  AST 19  ALT 26  ALKPHOS 79  BILITOT 0.7  PROT 7.1  ALBUMIN 2.7*   No results for input(s): LIPASE, AMYLASE in the last 168 hours. No results for input(s): AMMONIA in the last 168 hours. Diabetic: No results for input(s): HGBA1C in the last 72 hours. Recent  Labs  Lab 12/05/20 0352 12/05/20 0814 12/05/20 1115  GLUCAP 172* 112* 113*   Cardiac Enzymes: No results for input(s): CKTOTAL, CKMB, CKMBINDEX, TROPONINI in the last 168 hours. No results for input(s): PROBNP in the last 8760 hours. Coagulation Profile: No results for input(s): INR, PROTIME in the last 168 hours. Thyroid Function Tests: No results for input(s): TSH, T4TOTAL, FREET4, T3FREE, THYROIDAB in the last 72 hours. Lipid Profile: No results for input(s): CHOL, HDL, LDLCALC, TRIG, CHOLHDL, LDLDIRECT in the last 72 hours. Anemia Panel: No results for input(s): VITAMINB12, FOLATE, FERRITIN, TIBC, IRON, RETICCTPCT in the last 72 hours. Urine analysis: No  results found for: COLORURINE, APPEARANCEUR, LABSPEC, PHURINE, GLUCOSEU, HGBUR, BILIRUBINUR, KETONESUR, PROTEINUR, UROBILINOGEN, NITRITE, LEUKOCYTESUR Sepsis Labs: Invalid input(s): PROCALCITONIN, LACTICIDVEN  Microbiology: Recent Results (from the past 240 hour(s))  Surgical PCR screen     Status: None   Collection Time: 12/05/20  5:25 AM   Specimen: Nasal Mucosa; Nasal Swab  Result Value Ref Range Status   MRSA, PCR NEGATIVE NEGATIVE Final   Staphylococcus aureus NEGATIVE NEGATIVE Final    Comment: (NOTE) The Xpert SA Assay (FDA approved for NASAL specimens in patients 29 years of age and older), is one component of a comprehensive surveillance program. It is not intended to diagnose infection nor to guide or monitor treatment. Performed at [facility], [address], [place], [place] [address]     Radiology Studies: DG Foot Complete Right  Result Date: 12/04/2020 Please see detailed radiograph report in office note.    Elaisha Zahniser T. Nicky Kras [facility]  If 7PM-7AM, please contact night-coverage [email or web address] 12/05/2020, 11:55 AM

## 2020-12-06 ENCOUNTER — Inpatient Hospital Stay (HOSPITAL_COMMUNITY): Payer: Commercial Managed Care - PPO

## 2020-12-06 DIAGNOSIS — I96 Gangrene, not elsewhere classified: Secondary | ICD-10-CM

## 2020-12-06 DIAGNOSIS — F17200 Nicotine dependence, unspecified, uncomplicated: Secondary | ICD-10-CM

## 2020-12-06 DIAGNOSIS — L0390 Cellulitis, unspecified: Secondary | ICD-10-CM

## 2020-12-06 DIAGNOSIS — M86171 Other acute osteomyelitis, right ankle and foot: Secondary | ICD-10-CM

## 2020-12-06 DIAGNOSIS — D72825 Bandemia: Secondary | ICD-10-CM

## 2020-12-06 DIAGNOSIS — D649 Anemia, unspecified: Secondary | ICD-10-CM

## 2020-12-06 LAB — SEDIMENTATION RATE: Sed Rate: 108 mm/hr — ABNORMAL HIGH (ref 0–16)

## 2020-12-06 LAB — CBC WITH DIFFERENTIAL/PLATELET
Abs Immature Granulocytes: 0.03 10*3/uL (ref 0.00–0.07)
Basophils Absolute: 0.1 10*3/uL (ref 0.0–0.1)
Basophils Relative: 0 %
Eosinophils Absolute: 0.2 10*3/uL (ref 0.0–0.5)
Eosinophils Relative: 2 %
HCT: 27.1 % — ABNORMAL LOW (ref 39.0–52.0)
Hemoglobin: 9.1 g/dL — ABNORMAL LOW (ref 13.0–17.0)
Immature Granulocytes: 0 %
Lymphocytes Relative: 15 %
Lymphs Abs: 1.8 10*3/uL (ref 0.7–4.0)
MCH: 24.7 pg — ABNORMAL LOW (ref 26.0–34.0)
MCHC: 33.6 g/dL (ref 30.0–36.0)
MCV: 73.6 fL — ABNORMAL LOW (ref 80.0–100.0)
Monocytes Absolute: 1.1 10*3/uL — ABNORMAL HIGH (ref 0.1–1.0)
Monocytes Relative: 9 %
Neutro Abs: 9 10*3/uL — ABNORMAL HIGH (ref 1.7–7.7)
Neutrophils Relative %: 74 %
Platelets: 332 10*3/uL (ref 150–400)
RBC: 3.68 MIL/uL — ABNORMAL LOW (ref 4.22–5.81)
RDW: 13.2 % (ref 11.5–15.5)
WBC: 12.2 10*3/uL — ABNORMAL HIGH (ref 4.0–10.5)
nRBC: 0 % (ref 0.0–0.2)

## 2020-12-06 LAB — GLUCOSE, CAPILLARY
Glucose-Capillary: 124 mg/dL — ABNORMAL HIGH (ref 70–99)
Glucose-Capillary: 129 mg/dL — ABNORMAL HIGH (ref 70–99)
Glucose-Capillary: 150 mg/dL — ABNORMAL HIGH (ref 70–99)
Glucose-Capillary: 153 mg/dL — ABNORMAL HIGH (ref 70–99)
Glucose-Capillary: 195 mg/dL — ABNORMAL HIGH (ref 70–99)
Glucose-Capillary: 86 mg/dL (ref 70–99)

## 2020-12-06 LAB — IRON AND TIBC
Iron: 22 ug/dL — ABNORMAL LOW (ref 45–182)
Saturation Ratios: 9 % — ABNORMAL LOW (ref 17.9–39.5)
TIBC: 232 ug/dL — ABNORMAL LOW (ref 250–450)
UIBC: 210 ug/dL

## 2020-12-06 LAB — FERRITIN: Ferritin: 436 ng/mL — ABNORMAL HIGH (ref 24–336)

## 2020-12-06 LAB — RESP PANEL BY RT-PCR (FLU A&B, COVID) ARPGX2
Influenza A by PCR: NEGATIVE
Influenza B by PCR: NEGATIVE
SARS Coronavirus 2 by RT PCR: NEGATIVE

## 2020-12-06 LAB — RETICULOCYTES
Immature Retic Fract: 13 % (ref 2.3–15.9)
RBC.: 3.65 MIL/uL — ABNORMAL LOW (ref 4.22–5.81)
Retic Count, Absolute: 35.4 10*3/uL (ref 19.0–186.0)
Retic Ct Pct: 1 % (ref 0.4–3.1)

## 2020-12-06 LAB — LIPID PANEL
Cholesterol: 165 mg/dL (ref 0–200)
HDL: 26 mg/dL — ABNORMAL LOW (ref >40)
LDL Cholesterol: 117 mg/dL — ABNORMAL HIGH (ref 0–99)
Total CHOL/HDL Ratio: 6.3 RATIO
Triglycerides: 108 mg/dL (ref <150)
VLDL: 22 mg/dL (ref 0–40)

## 2020-12-06 LAB — C-REACTIVE PROTEIN: CRP: 10 mg/dL — ABNORMAL HIGH (ref <1.0)

## 2020-12-06 LAB — FOLATE: Folate: 13 ng/mL (ref >5.9)

## 2020-12-06 LAB — HEMOGLOBIN A1C
Hgb A1c MFr Bld: 9.1 % — ABNORMAL HIGH (ref 4.8–5.6)
Mean Plasma Glucose: 214.47 mg/dL

## 2020-12-06 LAB — VITAMIN B12: Vitamin B-12: 285 pg/mL (ref 180–914)

## 2020-12-06 MED ORDER — CHLORHEXIDINE GLUCONATE CLOTH 2 % EX PADS
6.0000 | MEDICATED_PAD | Freq: Once | CUTANEOUS | Status: AC
  Administered 2020-12-07: 6 via TOPICAL

## 2020-12-06 MED ORDER — GADOBUTROL 1 MMOL/ML IV SOLN
8.7000 mL | Freq: Once | INTRAVENOUS | Status: AC | PRN
  Administered 2020-12-06: 8.7 mL via INTRAVENOUS

## 2020-12-06 MED ORDER — ATORVASTATIN CALCIUM 40 MG PO TABS
40.0000 mg | ORAL_TABLET | Freq: Every day | ORAL | Status: DC
  Administered 2020-12-06 – 2020-12-12 (×6): 40 mg via ORAL
  Filled 2020-12-06 (×6): qty 1

## 2020-12-06 NOTE — Plan of Care (Signed)

## 2020-12-06 NOTE — Progress Notes (Signed)
PROGRESS NOTE  Benjamin Russo WER:[id number] DOB: 1976/06/09   PCP: Charlott Rakes, MD  Patient is from: Home  DOA: 12/04/2020 LOS: 2  Chief complaints: Right foot infection and wound  Brief Narrative / Interim history: 44 year old male with history of IDDM-2, tobacco use, HTN, anemia and recent right fourth toe amputation for possible gangrene about 2 weeks ago admitted from podiatry office, Dr. [name] with concern for osteomyelitis and gangrene in right fifth toe and PAD.  Patient has had discoloration and discharge from fifth toe for about a week.  Also subjective fever, chills and night sweats intermittently.  Per podiatry note, had x-ray concerning for bone erosion.  He was started on vancomycin and cefepime, and admitted. ABI with noncompressible BLE arteries and significantly reduced TBI.  Vascular surgery consulted.  Subjective: Seen and examined earlier this morning.  No major events overnight of this morning.  Denies pain except for intermittent tingling in his foot.  No other issues.  Objective: Vitals:   12/05/20 1622 12/05/20 1958 12/06/20 0400 12/06/20 0755  BP: (!) 145/78 (!) 158/76 (!) 148/68 117/71  Pulse: 88 77 80 81  Resp: 18 15 16 16   Temp: 98.9 F (37.2 C) 98 F (36.7 C) 98.1 F (36.7 C) 98.5 F (36.9 C)  TempSrc: Oral Oral Oral Oral  SpO2: 99% 98% 99% 97%  Weight:      Height:        Intake/Output Summary (Last 24 hours) at 12/06/2020 1245 Last data filed at 12/06/2020 0900 Gross per 24 hour  Intake 3142.86 ml  Output 2100 ml  Net 1042.86 ml   Filed Weights   12/04/20 2111  Weight: 87.3 kg    Examination:  GENERAL: No apparent distress.  Nontoxic. HEENT: MMM.  Vision and hearing grossly intact.  NECK: Supple.  No apparent JVD.  RESP: On RA.  No IWOB.  Fair aeration bilaterally. CVS:  RRR. Heart sounds normal.  ABD/GI/GU: BS+. Abd soft, NTND.  MSK/EXT:  Moves extremities. No apparent deformity. No edema.  1+ DP pulse in RLE.  PT not  palpable. SKIN: Discoloration of right fifth toe  NEURO: Awake, alert and oriented appropriately.  No apparent focal neuro deficit. PSYCH: Calm. Normal affect.  Procedures:  None  Microbiology summarized: COVID-19 and influenza PCR nonreactive Blood cultures NGTD. MRSA PCR nonreactive.  Assessment & Plan: Cellulitis with possible osteomyelitis and gangrene of right fifth toe Recent right first toe amputation for possible gangrene Bilateral PAD -ABI with noncompressible arteries in BLE and severely reduced TBI bilaterally -Blood cultures NGTD. -Continue broad-spectrum antibiotic with vancomycin and cefepime -Podiatry following. -Vascular surgery consulted.  Uncontrolled IDDM-2 with hyperglycemia: A1c 9.1%.  Seems to be on Lantus 25 units nightly and Metformin 500 mg twice daily at home. Recent Labs  Lab 12/05/20 2127 12/06/20 0015 12/06/20 0602 12/06/20 0736 12/06/20 1048  GLUCAP 134* 150* 129* 124* 86  -Continue Lantus 10 units nightly and SSI-sensitive. -Needs to be on a statin.  Essential hypertension: Normotensive. -Continue home metoprolol.  Microcytic anemia: Baseline Hgb 11-12>> 8.7> 9.1.  Denies melena or hematochezia.  Anemia panel favors anemia of chronic disease. -Continue monitoring  Tobacco use disorder: Reports smoking about 2 cigarettes a day. -Encouraged cessation. -Nicotine patch as needed  Leukocytosis with bandemia: Likely due to #1. -Continue monitoring  Body mass index is 27.62 kg/m.         DVT prophylaxis:  SCDs Start: 12/05/20 0140  Code Status: Full code Family Communication: Patient and/or RN. Available if any question.  Status is: Inpatient  Remains inpatient appropriate because:Ongoing diagnostic testing needed not appropriate for outpatient work up, IV treatments appropriate due to intensity of illness or inability to take PO and Inpatient level of care appropriate due to severity of illness   Dispo: The patient is from:  Home              Anticipated d/c is to: Home              Anticipated d/c date is: 3 days              Patient currently is not medically stable to d/c.       Consultants:  Podiatry Vascular surgery   Sch Meds:  Scheduled Meds: . atorvastatin  40 mg Oral Daily  . insulin aspart  0-9 Units Subcutaneous Q4H  . insulin glargine  10 Units Subcutaneous QHS  . metoprolol succinate  25 mg Oral Daily   Continuous Infusions: . ceFEPime (MAXIPIME) IV 2 g (12/06/20 1010)  . vancomycin 1,000 mg (12/06/20 0154)   PRN Meds:.acetaminophen **OR** acetaminophen  Antimicrobials: Anti-infectives (From admission, onward)   Start     Dose/Rate Route Frequency Ordered Stop   12/05/20 1000  vancomycin (VANCOCIN) IVPB 1000 mg/200 mL premix        1,000 mg 200 mL/hr over 60 Minutes Intravenous Every 8 hours 12/05/20 0525     12/05/20 1000  ceFEPIme (MAXIPIME) 2 g in sodium chloride 0.9 % 100 mL IVPB        2 g 200 mL/hr over 30 Minutes Intravenous Every 8 hours 12/05/20 0525     12/05/20 0230  vancomycin (VANCOREADY) IVPB 1500 mg/300 mL        1,500 mg 150 mL/hr over 120 Minutes Intravenous  Once 12/05/20 0142 12/05/20 0447   12/05/20 0230  ceFEPIme (MAXIPIME) 2 g in sodium chloride 0.9 % 100 mL IVPB        2 g 200 mL/hr over 30 Minutes Intravenous  Once 12/05/20 0142 12/05/20 0314       I have personally reviewed the following labs and images: CBC: Recent Labs  Lab 12/05/20 0425 12/06/20 0327  WBC 10.3 12.2*  NEUTROABS 6.9 9.0*  HGB 8.7* 9.1*  HCT 25.5* 27.1*  MCV 73.5* 73.6*  PLT 323 332   BMP &GFR Recent Labs  Lab 12/05/20 0425  NA 135  K 4.6  CL 104  CO2 22  GLUCOSE 172*  BUN 23*  CREATININE 1.20  CALCIUM 8.9   Estimated Creatinine Clearance: 82.8 mL/min (by C-G formula based on SCr of 1.2 mg/dL). Liver & Pancreas: Recent Labs  Lab 12/05/20 0425  AST 19  ALT 26  ALKPHOS 79  BILITOT 0.7  PROT 7.1  ALBUMIN 2.7*   No results for input(s): LIPASE, AMYLASE  in the last 168 hours. No results for input(s): AMMONIA in the last 168 hours. Diabetic: Recent Labs    12/06/20 0327  HGBA1C 9.1*   Recent Labs  Lab 12/05/20 2127 12/06/20 0015 12/06/20 0602 12/06/20 0736 12/06/20 1048  GLUCAP 134* 150* 129* 124* 86   Cardiac Enzymes: No results for input(s): CKTOTAL, CKMB, CKMBINDEX, TROPONINI in the last 168 hours. No results for input(s): PROBNP in the last 8760 hours. Coagulation Profile: No results for input(s): INR, PROTIME in the last 168 hours. Thyroid Function Tests: No results for input(s): TSH, T4TOTAL, FREET4, T3FREE, THYROIDAB in the last 72 hours. Lipid Profile: Recent Labs    12/06/20 0327  CHOL 165  HDL  26*  LDLCALC 117*  TRIG 108  CHOLHDL 6.3   Anemia Panel: Recent Labs    12/06/20 0327  VITAMINB12 285  FOLATE 13.0  FERRITIN 436*  TIBC 232*  IRON 22*  RETICCTPCT 1.0   Urine analysis: No results found for: COLORURINE, APPEARANCEUR, LABSPEC, PHURINE, GLUCOSEU, HGBUR, BILIRUBINUR, KETONESUR, PROTEINUR, UROBILINOGEN, NITRITE, LEUKOCYTESUR Sepsis Labs: Invalid input(s): PROCALCITONIN, LACTICIDVEN  Microbiology: Recent Results (from the past 240 hour(s))  Culture, blood (routine x 2)     Status: None (Preliminary result)   Collection Time: 12/05/20  4:14 AM   Specimen: BLOOD  Result Value Ref Range Status   Specimen Description BLOOD RIGHT HAND  Final   Special Requests   Final    BOTTLES DRAWN AEROBIC AND ANAEROBIC Blood Culture results may not be optimal due to an excessive volume of blood received in culture bottles   Culture   Final    NO GROWTH 1 DAY Performed at [facility], [address], [place], [place] [address]    Report Status PENDING  Incomplete  Culture, blood (routine x 2)     Status: None (Preliminary result)   Collection Time: 12/05/20  4:25 AM   Specimen: BLOOD  Result Value Ref Range Status   Specimen Description BLOOD LEFT FOREARM  Final   Special Requests   Final    BOTTLES  DRAWN AEROBIC AND ANAEROBIC Blood Culture results may not be optimal due to an excessive volume of blood received in culture bottles   Culture   Final    NO GROWTH 1 DAY Performed at [facility], [address] [place], [place] [address]    Report Status PENDING  Incomplete  Surgical PCR screen     Status: None   Collection Time: 12/05/20  5:25 AM   Specimen: Nasal Mucosa; Nasal Swab  Result Value Ref Range Status   MRSA, PCR NEGATIVE NEGATIVE Final   Staphylococcus aureus NEGATIVE NEGATIVE Final    Comment: (NOTE) The Xpert SA Assay (FDA approved for NASAL specimens in patients 59 years of age and older), is one component of a comprehensive surveillance program. It is not intended to diagnose infection nor to guide or monitor treatment. Performed at [facility], [address], [place], [place] [address]   Resp Panel by RT-PCR (Flu A&B, Covid) Nasopharyngeal Swab     Status: None   Collection Time: 12/06/20  8:13 AM   Specimen: Nasopharyngeal Swab; Nasopharyngeal(NP) swabs in vial transport medium  Result Value Ref Range Status   SARS Coronavirus 2 by RT PCR NEGATIVE NEGATIVE Final    Comment: (NOTE) SARS-CoV-2 target nucleic acids are NOT DETECTED.  The SARS-CoV-2 RNA is generally detectable in upper respiratory specimens during the acute phase of infection. The lowest concentration of SARS-CoV-2 viral copies this assay can detect is 138 copies/mL. A negative result does not preclude SARS-Cov-2 infection and should not be used as the sole basis for treatment or other patient management decisions. A negative result may occur with  improper specimen collection/handling, submission of specimen other than nasopharyngeal swab, presence of viral mutation(s) within the areas targeted by this assay, and inadequate number of viral copies(<138 copies/mL). A negative result must be combined with clinical observations, patient history, and epidemiological information. The  expected result is Negative.  Fact Sheet for Patients:  BloggerCourse.com  Fact Sheet for Healthcare Providers:  SeriousBroker.it  This test is no t yet approved or cleared by the [place] FDA and  has  been authorized for detection and/or diagnosis of SARS-CoV-2 by FDA under an Emergency Use Authorization (EUA). This EUA will remain  in effect (meaning this test can be used) for the duration of the COVID-19 declaration under Section 564(b)(1) of the Act, 21 U.S.C.section 360bbb-3(b)(1), unless the authorization is terminated  or revoked sooner.       Influenza A by PCR NEGATIVE NEGATIVE Final   Influenza B by PCR NEGATIVE NEGATIVE Final    Comment: (NOTE) The Xpert Xpress SARS-CoV-2/FLU/RSV plus assay is intended as an aid in the diagnosis of influenza from Nasopharyngeal swab specimens and should not be used as a sole basis for treatment. Nasal washings and aspirates are unacceptable for Xpert Xpress SARS-CoV-2/FLU/RSV testing.  Fact Sheet for Patients: BloggerCourse.com  Fact Sheet for Healthcare Providers: SeriousBroker.it  This test is not yet approved or cleared by the [place] FDA and has been authorized for detection and/or diagnosis of SARS-CoV-2 by FDA under an Emergency Use Authorization (EUA). This EUA will remain in effect (meaning this test can be used) for the duration of the COVID-19 declaration under Section 564(b)(1) of the Act, 21 U.S.C. section 360bbb-3(b)(1), unless the authorization is terminated or revoked.  Performed at [facility], [address], [place], [place] [address]     Radiology Studies: VAS [place] ABI WITH/WO TBI  Result Date: 12/06/2020 LOWER EXTREMITY DOPPLER STUDY Indications: Ulceration, and gangrene. High Risk Factors: Hypertension, Diabetes, current smoker.  Limitations: Today's exam was limited due to Irregular heart  beat. Comparison Study: No prior study Performing Technologist: Sherren Kerns RVS  Examination Guidelines: A complete evaluation includes at minimum, Doppler waveform signals and systolic blood pressure reading at the level of bilateral brachial, anterior tibial, and posterior tibial arteries, when vessel segments are accessible. Bilateral testing is considered an integral part of a complete examination. Photoelectric Plethysmograph (PPG) waveforms and toe systolic pressure readings are included as required and additional duplex testing as needed. Limited examinations for reoccurring indications may be performed as noted.  ABI Findings: +---------+------------------+-----+-----------+--------+ Right    Rt Pressure (mmHg)IndexWaveform   Comment  +---------+------------------+-----+-----------+--------+ Brachial 136                    multiphasic         +---------+------------------+-----+-----------+--------+ PTA      254               1.87 multiphasic         +---------+------------------+-----+-----------+--------+ DP       254               1.87 multiphasic         +---------+------------------+-----+-----------+--------+ Great Toe47                0.35                     +---------+------------------+-----+-----------+--------+ +---------+------------------+-----+-----------+-------+ Left     Lt Pressure (mmHg)IndexWaveform   Comment +---------+------------------+-----+-----------+-------+ Brachial 132                    multiphasic        +---------+------------------+-----+-----------+-------+ PTA      254               1.87 multiphasic        +---------+------------------+-----+-----------+-------+ DP       254               1.87 multiphasic        +---------+------------------+-----+-----------+-------+  Great Toe57                0.42                    +---------+------------------+-----+-----------+-------+  +-------+-----------+-----------+------------+------------+ ABI/TBIToday's ABIToday's TBIPrevious ABIPrevious TBI +-------+-----------+-----------+------------+------------+ Right  1.87       0.35                                +-------+-----------+-----------+------------+------------+ Left   1.87       0.42                                +-------+-----------+-----------+------------+------------+ Arterial wall calcification precludes accurate ankle pressures and ABIs.  Summary: Right: Resting right ankle-brachial index indicates noncompressible right lower extremity arteries. The right toe-brachial index is abnormal. Left: Resting left ankle-brachial index indicates noncompressible left lower extremity arteries. The left toe-brachial index is abnormal.  *See table(s) above for measurements and observations.     Preliminary      Rozelia Catapano T. Tishina Lown [facility]  If 7PM-7AM, please contact night-coverage [email or web address] 12/06/2020, 12:45 PM

## 2020-12-06 NOTE — Plan of Care (Signed)
  Problem: Education: Goal: Knowledge of General Education information will improve Description: Including pain rating scale, medication(s)/side effects and non-pharmacologic comfort measures Outcome: Progressing   Problem: Clinical Measurements: Goal: Ability to maintain clinical measurements within normal limits will improve Outcome: Progressing Goal: Will remain free from infection Outcome: Progressing Goal: Diagnostic test results will improve Outcome: Progressing Goal: Respiratory complications will improve Outcome: Progressing Goal: Cardiovascular complication will be avoided Outcome: Progressing   Problem: Clinical Measurements: Goal: Will remain free from infection Outcome: Progressing   Problem: Coping: Goal: Level of anxiety will decrease Outcome: Progressing   Problem: Pain Managment: Goal: General experience of comfort will improve Outcome: Progressing

## 2020-12-06 NOTE — Progress Notes (Signed)
Subjective: 44 y.o. male with history of diabetes and tobacco use presents with gangrenous changes to right fourth toe amputation site. Surgery performed at an outside facility in [place] on November 24, 2020.  Was noted for worsening wounds, gangrene of the toes, infection.  Today states he is doing well no pain to the foot., Denies any fevers, chills, nausea,.  No calf pain, chest pain, shortness of breath.   Objective: AAO x3, NAD Exam mostly unchanged.  Gangrenous changes present the fifth toe.  Previous fourth partial amputation with wound that has not healed.  The wound probes are down to bone.  Mild edema of the foot.  There is no significant warmth to the foot there is no drainage or pus there is no fluctuation or crepitation. No pain with calf compression, swelling, warmth, erythema  Assessment: Gangrene fifth toe, osteomyelitis  Plan: Appreciate vascular surgery input.  Reviewed the MRI with him.  Also discussed the case with Dr. [name].  We discussed conservative versus surgical options and I think surgical intervention is the best choice at this time.  Discussed with the patient partial fourth and fifth ray amputations.  Discussed that the MRI was concerning for other areas that show inflammation.  After discussion of the case we will proceed with partial fourth and fifth ray amputation.  This will likely be done with Dr. [name] tomorrow.  I discussed the surgery with the patient including risks of surgery including, but not limited to, spread of infection, delayed or nonhealing, proximal amputation as well as general risks of surgery.  He wishes to proceed.  Will be n.p.o. after midnight.  Ovid Curd, DPM

## 2020-12-06 NOTE — Progress Notes (Signed)
VASCULAR LAB    ABIs have been performed.  See CV proc for preliminary results.   Kanav Kazmierczak, RVT 12/06/2020, 11:48 AM

## 2020-12-06 NOTE — Consult Note (Addendum)
Hospital Consult    Reason for Consult:  Diabetic foot ulcer; non-healing toe amputation site Requesting Physician:  Dr. Alanda Slim MRN #:  [id number]  History of Present Illness: This is a 44 y.o. male with history of diabetes and tobacco use presents with gangrenous changes to right fourth toe amputation site. Surgery performed at foot center in [place] on November 24, 2020.  Previous 5th toe amputation.  He ambulates without aid. Denies claudication, rest pain. Endorses lack of pain sensation in wound. No fever or chills. No history of previous vascular or cardiac intervention or surgery. Denies history of stroke/TIAs.  The pt is on a statin for cholesterol management.  The pt is not on a daily aspirin.   Other AC:  none The pt is on BB for hypertension.   The pt is diabetic.  insulin Tobacco hx:  Quit 3 weeks ago  Past Medical History:  Diagnosis Date  . Diabetes mellitus without complication (HCC)   . Hypertension     Past Surgical History:  Procedure Laterality Date  . FINGER SURGERY Left 2019    No Known Allergies  Prior to Admission medications   Medication Sig Start Date End Date Taking? Authorizing Provider  acetaminophen (TYLENOL) 325 MG tablet Take 650 mg by mouth every 6 (six) hours as needed for mild pain, fever or headache.   Yes [provider]  insulin glargine (LANTUS SOLOSTAR) 100 UNIT/ML Solostar Pen Inject 25 Units into the skin at bedtime.  10/24/20  Yes [provider]  metFORMIN (GLUCOPHAGE) 500 MG tablet Take 500 mg by mouth 2 (two) times daily with a meal.  10/10/20  Yes [provider]  metoprolol succinate (TOPROL-XL) 25 MG 24 hr tablet Take 25 mg by mouth daily. 11/14/20  Yes [provider]  BD PEN NEEDLE NANO 2ND GEN 32G X 4 MM MISC SMARTSIG:Injection As Directed 10/10/20   [provider]  Continuous Blood Gluc Sensor (FREESTYLE LIBRE 2 SENSOR) MISC See admin instructions. 11/17/20   [provider]  doxycycline (VIBRA-TABS) 100 MG tablet Take 1 tablet (100 mg total) by mouth 2 (two) times daily. 12/04/20   Park Liter, DPM    Social History   Socioeconomic History  . Marital status: Married    Spouse name: Human resources officer  . Number of children: 1  . Years of education: 85  . Highest education level: GED or equivalent  Occupational History  . Occupation: Warehouse    Comment: Not currently working  Tobacco Use  . Smoking status: Current Every Day Smoker    Packs/day: 0.25    Types: Cigarettes  . Smokeless tobacco: Never Used  Vaping Use  . Vaping Use: Never used  Substance and Sexual Activity  . Alcohol use: Yes    Comment: occasional  . Drug use: Never  . Sexual activity: Not Currently  Other Topics Concern  . Not on file  Social History Narrative  . Not on file   Social Determinants of Health   Financial Resource Strain:   . Difficulty of Paying Living Expenses: Not on file  Food Insecurity:   . Worried About Programme researcher, broadcasting/film/video in the Last Year: Not on file  . Ran Out of Food in the Last Year: Not on file  Transportation Needs:   . Lack of Transportation (Medical): Not on file  . Lack of Transportation (Non-Medical): Not on file  Physical Activity:   . Days of Exercise per Week: Not on file  . Minutes  of Exercise per Session: Not on file  Stress:   . Feeling of Stress : Not on file  Social Connections:   . Frequency of Communication with Friends and Family: Not on file  . Frequency of Social Gatherings with Friends and Family: Not on file  . Attends Religious Services: Not on file  . Active Member of Clubs or Organizations: Not on file  . Attends Banker Meetings: Not on file  . Marital Status: Not on file  Intimate Partner Violence:   . Fear of Current or Ex-Partner: Not on file  . Emotionally Abused: Not on file  . Physically Abused: Not on file  . Sexually Abused: Not on file     Family History  Problem Relation Age of  Onset  . Diabetes Mellitus II Father   . Diabetes Mellitus II Brother     ROS: [x]  Positive   [ ]  Negative   [ ]  All sytems reviewed and are negative  Cardiac: []  chest pain/pressure []  palpitations []  SOB lying flat []  DOE  Vascular: []  pain in legs while walking []  pain in legs at rest []  pain in legs at night [x]  non-healing ulcers []  hx of DVT []  swelling in legs  Pulmonary: []  productive cough []  asthma/wheezing []  home O2  Neurologic: []  weakness in []  arms []  legs []  numbness in []  arms []  legs []  hx of CVA []  mini stroke [] difficulty speaking or slurred speech []  temporary loss of vision in one eye []  dizziness  Hematologic: []  hx of cancer []  bleeding problems []  problems with blood clotting easily  Endocrine:   [x]  diabetes []  thyroid disease  GI []  vomiting blood []  blood in stool  GU: []  CKD/renal failure []  HD--[]  M/W/F or []  T/T/S []  burning with urination []  blood in urine  Psychiatric: []  anxiety []  depression  Musculoskeletal: []  arthritis []  joint pain  Integumentary: []  rashes []  ulcers  Constitutional: []  fever []  chills   Physical Examination  Vitals:   12/06/20 0400 12/06/20 0755  BP: (!) 148/68 117/71  Pulse: 80 81  Resp: 16 16  Temp: 98.1 F (36.7 C) 98.5 F (36.9 C)  SpO2: 99% 97%   Body mass index is 27.62 kg/m.  General:  WDWN in NAD Gait: Not observed HENT: WNL, normocephalic Pulmonary: normal non-labored breathing, Cardiac: regular,  Abdomen:  soft, NT/ND, no masses Skin: without rashes Vascular Exam/Pulses:  Right Left  Radial 1+ (weak) 1+ (weak)  Ulnar absent absent  Brachial 2+ (normal) 2+ (normal)  Femoral 2+ (normal) 2+ (normal)  Popliteal 2+ (normal) 2+ (normal)  DP 2+ (normal) 2+ (normal)  PT absent absent  Peroneal Not eval Not eval   Extremities: with ischemic changes, with Gangrene , with cellulitis; with open wounds; foul odor His right foot sensation is intact to touch but has  decreased sensation in wound. Normal motor function Musculoskeletal: no muscle wasting or atrophy  Neurologic: A&O X 3;  No focal weakness or paresthesias are detected; speech is fluent/normal Psychiatric:  The pt has Normal affect.    CBC    Component Value Date/Time   WBC 12.2 (H) 12/06/2020 0327   RBC 3.65 (L) 12/06/2020 0327   RBC 3.68 (L) 12/06/2020 0327   HGB 9.1 (L) 12/06/2020 0327   HCT 27.1 (L) 12/06/2020 0327   PLT 332 12/06/2020 0327   MCV 73.6 (L) 12/06/2020 0327   MCH 24.7 (L) 12/06/2020 0327   MCHC 33.6 12/06/2020 0327   RDW 13.2 12/06/2020  0327   LYMPHSABS 1.8 12/06/2020 0327   MONOABS 1.1 (H) 12/06/2020 0327   EOSABS 0.2 12/06/2020 0327   BASOSABS 0.1 12/06/2020 0327    BMET    Component Value Date/Time   NA 135 12/05/2020 0425   K 4.6 12/05/2020 0425   CL 104 12/05/2020 0425   CO2 22 12/05/2020 0425   GLUCOSE 172 (H) 12/05/2020 0425   BUN 23 (H) 12/05/2020 0425   CREATININE 1.20 12/05/2020 0425   CALCIUM 8.9 12/05/2020 0425   GFRNONAA >60 12/05/2020 0425    COAGS: No results found for: INR, PROTIME   Non-Invasive Vascular Imaging:   12/06/2020 ABIs ABI/TBIToday's ABIToday's TBIPrevious ABIPrevious TBI  +-------+-----------+-----------+------------+------------+  Right 1.87    0.35                  +-------+-----------+-----------+------------+------------+  Left  1.87    0.42                  Right TP= 47 Left TP= 57   ASSESSMENT/PLAN: This is a 44 y.o. male with history of DM and tobacco use presents with necrosis of 4th toe amputation site. ABIs non-compressible. Has palpable DP pulse. His right toe pressure is borderline for healing in diabetic.   Dr. Randie Heinz, on call vascular surgeon, will evaluate the patient and provide further recommendations regarding work-up.  - Wendi Maya, PA-C [facility] [phone].  I have independently interviewed and examined  patient and agree with PA assessment and plan above.  Patient has bilateral dorsalis pedis pulses unfortunately has not healed his fourth toe amputation appears to need 5th toe amputation as well.  Given that he has palpable pulses despite having moderately depressed toe pressures this appears to be small vessel disease not amenable to endovascular or open vascular intervention.  As such I discussed with him that his best course of action would be surgery followed by protecting his foot and controlling his blood glucose as best as possible.  He is at high risk for more proximal amputation.  Aspin Palomarez C. Randie Heinz, MD [facility] Office: [phone] Pager: [phone]

## 2020-12-07 ENCOUNTER — Encounter (HOSPITAL_COMMUNITY)
Admission: AD | Disposition: A | Discharge status: Home Health | Payer: Self-pay | Source: Ambulatory Visit | Attending: Student

## 2020-12-07 ENCOUNTER — Encounter (HOSPITAL_COMMUNITY): Payer: Self-pay | Admitting: Internal Medicine

## 2020-12-07 ENCOUNTER — Inpatient Hospital Stay (HOSPITAL_COMMUNITY): Payer: Commercial Managed Care - PPO | Admitting: Certified Registered"

## 2020-12-07 ENCOUNTER — Inpatient Hospital Stay (HOSPITAL_COMMUNITY): Payer: Commercial Managed Care - PPO

## 2020-12-07 DIAGNOSIS — M86171 Other acute osteomyelitis, right ankle and foot: Secondary | ICD-10-CM

## 2020-12-07 HISTORY — PX: I & D EXTREMITY: SHX5045

## 2020-12-07 LAB — GLUCOSE, CAPILLARY
Glucose-Capillary: 100 mg/dL — ABNORMAL HIGH (ref 70–99)
Glucose-Capillary: 116 mg/dL — ABNORMAL HIGH (ref 70–99)
Glucose-Capillary: 133 mg/dL — ABNORMAL HIGH (ref 70–99)
Glucose-Capillary: 153 mg/dL — ABNORMAL HIGH (ref 70–99)
Glucose-Capillary: 177 mg/dL — ABNORMAL HIGH (ref 70–99)
Glucose-Capillary: 184 mg/dL — ABNORMAL HIGH (ref 70–99)
Glucose-Capillary: 93 mg/dL (ref 70–99)

## 2020-12-07 LAB — BASIC METABOLIC PANEL
Anion gap: 10 (ref 5–15)
BUN: 17 mg/dL (ref 6–20)
CO2: 22 mmol/L (ref 22–32)
Calcium: 8.9 mg/dL (ref 8.9–10.3)
Chloride: 105 mmol/L (ref 98–111)
Creatinine, Ser: 1.07 mg/dL (ref 0.61–1.24)
GFR, Estimated: >60 mL/min (ref >60)
Glucose, Bld: 104 mg/dL — ABNORMAL HIGH (ref 70–99)
Potassium: 4.5 mmol/L (ref 3.5–5.1)
Sodium: 137 mmol/L (ref 135–145)

## 2020-12-07 LAB — VANCOMYCIN, TROUGH: Vancomycin Tr: 28 ug/mL (ref 15–20)

## 2020-12-07 SURGERY — IRRIGATION AND DEBRIDEMENT EXTREMITY
Anesthesia: General | Site: Foot | Laterality: Right

## 2020-12-07 MED ORDER — SUCCINYLCHOLINE CHLORIDE 200 MG/10ML IV SOSY
PREFILLED_SYRINGE | INTRAVENOUS | Status: AC
  Filled 2020-12-07: qty 10

## 2020-12-07 MED ORDER — EPHEDRINE 5 MG/ML INJ
INTRAVENOUS | Status: AC
  Filled 2020-12-07: qty 10

## 2020-12-07 MED ORDER — CHLORHEXIDINE GLUCONATE 0.12 % MT SOLN
15.0000 mL | Freq: Once | OROMUCOSAL | Status: AC
  Administered 2020-12-07: 15 mL via OROMUCOSAL
  Filled 2020-12-07: qty 15

## 2020-12-07 MED ORDER — PROPOFOL 10 MG/ML IV BOLUS
INTRAVENOUS | Status: DC | PRN
  Administered 2020-12-07: 200 mg via INTRAVENOUS

## 2020-12-07 MED ORDER — HYDROMORPHONE HCL 1 MG/ML IJ SOLN: 0.2500 mg | INTRAMUSCULAR | Status: DC | PRN

## 2020-12-07 MED ORDER — LACTATED RINGERS IV SOLN: INTRAVENOUS | Status: DC

## 2020-12-07 MED ORDER — HEMOSTATIC AGENTS (NO CHARGE) OPTIME
TOPICAL | Status: DC | PRN
  Administered 2020-12-07: 1 via TOPICAL

## 2020-12-07 MED ORDER — BUPIVACAINE HCL (PF) 0.5 % IJ SOLN
INTRAMUSCULAR | Status: DC | PRN
  Administered 2020-12-07: 10 mL

## 2020-12-07 MED ORDER — DEXAMETHASONE SODIUM PHOSPHATE 10 MG/ML IJ SOLN
INTRAMUSCULAR | Status: DC | PRN
  Administered 2020-12-07: 5 mg via INTRAVENOUS

## 2020-12-07 MED ORDER — IRRISEPT - 450ML BOTTLE WITH 0.05% CHG IN STERILE WATER, USP 99.95% OPTIME
TOPICAL | Status: DC | PRN
  Administered 2020-12-07: 450 mL

## 2020-12-07 MED ORDER — ONDANSETRON HCL 4 MG/2ML IJ SOLN
INTRAMUSCULAR | Status: AC
  Filled 2020-12-07: qty 2

## 2020-12-07 MED ORDER — ACETAMINOPHEN 325 MG PO TABS: 325.0000 mg | ORAL_TABLET | Freq: Once | ORAL | Status: DC | PRN

## 2020-12-07 MED ORDER — DEXAMETHASONE SODIUM PHOSPHATE 10 MG/ML IJ SOLN
INTRAMUSCULAR | Status: AC
  Filled 2020-12-07: qty 1

## 2020-12-07 MED ORDER — PHENYLEPHRINE 40 MCG/ML (10ML) SYRINGE FOR IV PUSH (FOR BLOOD PRESSURE SUPPORT)
PREFILLED_SYRINGE | INTRAVENOUS | Status: DC | PRN
  Administered 2020-12-07 (×2): 120 ug via INTRAVENOUS

## 2020-12-07 MED ORDER — MIDAZOLAM HCL 5 MG/5ML IJ SOLN
INTRAMUSCULAR | Status: DC | PRN
  Administered 2020-12-07: 2 mg via INTRAVENOUS

## 2020-12-07 MED ORDER — VANCOMYCIN HCL 1000 MG IV SOLR
INTRAVENOUS | Status: DC | PRN
  Administered 2020-12-07: 1000 mg via TOPICAL

## 2020-12-07 MED ORDER — LIDOCAINE HCL (PF) 2 % IJ SOLN
INTRAMUSCULAR | Status: AC
  Filled 2020-12-07: qty 5

## 2020-12-07 MED ORDER — MEPERIDINE HCL 25 MG/ML IJ SOLN
6.2500 mg | INTRAMUSCULAR | Status: DC | PRN
Start: 2020-12-07 — End: 2020-12-07

## 2020-12-07 MED ORDER — LIDOCAINE 2% (20 MG/ML) 5 ML SYRINGE
INTRAMUSCULAR | Status: DC | PRN
  Administered 2020-12-07: 60 mg via INTRAVENOUS

## 2020-12-07 MED ORDER — PHENYLEPHRINE 40 MCG/ML (10ML) SYRINGE FOR IV PUSH (FOR BLOOD PRESSURE SUPPORT)
PREFILLED_SYRINGE | INTRAVENOUS | Status: AC
  Filled 2020-12-07: qty 20

## 2020-12-07 MED ORDER — FENTANYL CITRATE (PF) 250 MCG/5ML IJ SOLN
INTRAMUSCULAR | Status: DC | PRN
  Administered 2020-12-07 (×2): 25 ug via INTRAVENOUS

## 2020-12-07 MED ORDER — SODIUM CHLORIDE 0.9 % IR SOLN
Status: DC | PRN
  Administered 2020-12-07: 3000 mL

## 2020-12-07 MED ORDER — EPHEDRINE SULFATE 50 MG/ML IJ SOLN
INTRAMUSCULAR | Status: DC | PRN
  Administered 2020-12-07: 10 mg via INTRAVENOUS

## 2020-12-07 MED ORDER — VANCOMYCIN HCL 1000 MG IV SOLR
INTRAVENOUS | Status: AC
  Filled 2020-12-07: qty 1000

## 2020-12-07 MED ORDER — 0.9 % SODIUM CHLORIDE (POUR BTL) OPTIME
TOPICAL | Status: DC | PRN
  Administered 2020-12-07: 1000 mL

## 2020-12-07 MED ORDER — ROCURONIUM BROMIDE 10 MG/ML (PF) SYRINGE
PREFILLED_SYRINGE | INTRAVENOUS | Status: AC
  Filled 2020-12-07: qty 10

## 2020-12-07 MED ORDER — PHENYLEPHRINE 40 MCG/ML (10ML) SYRINGE FOR IV PUSH (FOR BLOOD PRESSURE SUPPORT)
PREFILLED_SYRINGE | INTRAVENOUS | Status: AC
  Filled 2020-12-07: qty 10

## 2020-12-07 MED ORDER — PROPOFOL 10 MG/ML IV BOLUS
INTRAVENOUS | Status: AC
  Filled 2020-12-07: qty 20

## 2020-12-07 MED ORDER — ONDANSETRON HCL 4 MG/2ML IJ SOLN
INTRAMUSCULAR | Status: DC | PRN
  Administered 2020-12-07: 4 mg via INTRAVENOUS

## 2020-12-07 MED ORDER — MIDAZOLAM HCL 2 MG/2ML IJ SOLN
INTRAMUSCULAR | Status: AC
  Filled 2020-12-07: qty 2

## 2020-12-07 MED ORDER — ACETAMINOPHEN 160 MG/5ML PO SOLN: 325.0000 mg | Freq: Once | ORAL | Status: DC | PRN

## 2020-12-07 MED ORDER — AMISULPRIDE (ANTIEMETIC) 5 MG/2ML IV SOLN: 10.0000 mg | Freq: Once | INTRAVENOUS | Status: DC | PRN

## 2020-12-07 MED ORDER — FENTANYL CITRATE (PF) 250 MCG/5ML IJ SOLN
INTRAMUSCULAR | Status: AC
  Filled 2020-12-07: qty 5

## 2020-12-07 MED ORDER — BUPIVACAINE HCL (PF) 0.5 % IJ SOLN
INTRAMUSCULAR | Status: AC
  Filled 2020-12-07: qty 30

## 2020-12-07 MED ORDER — ASPIRIN EC 81 MG PO TBEC: 81.0000 mg | DELAYED_RELEASE_TABLET | Freq: Every day | ORAL | Status: DC

## 2020-12-07 MED ORDER — VANCOMYCIN HCL IN DEXTROSE 1-5 GM/200ML-% IV SOLN
1000.0000 mg | Freq: Two times a day (BID) | INTRAVENOUS | Status: DC
  Administered 2020-12-08 – 2020-12-09 (×4): 1000 mg via INTRAVENOUS
  Filled 2020-12-07 (×6): qty 200

## 2020-12-07 MED ORDER — ACETAMINOPHEN 10 MG/ML IV SOLN: 1000.0000 mg | Freq: Once | INTRAVENOUS | Status: DC | PRN

## 2020-12-07 SURGICAL SUPPLY — 54 items
BLADE LONG MED 31MMX9MM (MISCELLANEOUS) ×1
BLADE LONG MED 31X9 (MISCELLANEOUS) ×2 IMPLANT
BNDG ELASTIC 3X5.8 VLCR STR LF (GAUZE/BANDAGES/DRESSINGS) ×3 IMPLANT
BNDG ELASTIC 4X5.8 VLCR STR LF (GAUZE/BANDAGES/DRESSINGS) IMPLANT
BNDG ESMARK 4X9 LF (GAUZE/BANDAGES/DRESSINGS) ×3 IMPLANT
BNDG GAUZE ELAST 4 BULKY (GAUZE/BANDAGES/DRESSINGS) ×3 IMPLANT
CHLORAPREP W/TINT 26 (MISCELLANEOUS) ×3 IMPLANT
COVER SURGICAL LIGHT HANDLE (MISCELLANEOUS) ×3 IMPLANT
COVER WAND RF STERILE (DRAPES) ×3 IMPLANT
CUFF TOURN SGL QUICK 18X4 (TOURNIQUET CUFF) IMPLANT
CUFF TOURN SGL QUICK 34 (TOURNIQUET CUFF) ×2
CUFF TRNQT CYL 34X4.125X (TOURNIQUET CUFF) ×1 IMPLANT
DRAPE U-SHAPE 47X51 STRL (DRAPES) ×3 IMPLANT
DRSG PAD ABDOMINAL 8X10 ST (GAUZE/BANDAGES/DRESSINGS) ×3 IMPLANT
ELECT CAUTERY BLADE 6.4 (BLADE) ×3 IMPLANT
ELECT REM PT RETURN 9FT ADLT (ELECTROSURGICAL) ×3
ELECTRODE REM PT RTRN 9FT ADLT (ELECTROSURGICAL) ×1 IMPLANT
GAUZE PACKING IODOFORM 1X5 (PACKING) ×3 IMPLANT
GAUZE SPONGE 4X4 12PLY STRL (GAUZE/BANDAGES/DRESSINGS) ×6 IMPLANT
GLOVE BIO SURGEON STRL SZ7.5 (GLOVE) ×3 IMPLANT
GLOVE BIOGEL PI IND STRL 7.5 (GLOVE) ×1 IMPLANT
GLOVE BIOGEL PI IND STRL 8 (GLOVE) ×1 IMPLANT
GLOVE BIOGEL PI INDICATOR 7.5 (GLOVE) ×2
GLOVE BIOGEL PI INDICATOR 8 (GLOVE) ×2
GLOVE ECLIPSE 6.5 STRL STRAW (GLOVE) ×9 IMPLANT
GLOVE SURG SS PI 7.5 STRL IVOR (GLOVE) ×3 IMPLANT
GLOVE SURG UNDER POLY LF SZ7 (GLOVE) ×12 IMPLANT
GOWN STRL REUS W/ TWL LRG LVL3 (GOWN DISPOSABLE) ×1 IMPLANT
GOWN STRL REUS W/ TWL XL LVL3 (GOWN DISPOSABLE) ×1 IMPLANT
GOWN STRL REUS W/TWL LRG LVL3 (GOWN DISPOSABLE) ×2
GOWN STRL REUS W/TWL XL LVL3 (GOWN DISPOSABLE) ×2
HANDPIECE INTERPULSE COAX TIP (DISPOSABLE) ×2
HEMOSTAT SURGICEL 2X14 (HEMOSTASIS) ×3 IMPLANT
JET LAVAGE IRRISEPT WOUND (IRRIGATION / IRRIGATOR) ×3
KIT BASIN OR (CUSTOM PROCEDURE TRAY) ×3 IMPLANT
KIT TURNOVER KIT B (KITS) ×3 IMPLANT
LAVAGE JET IRRISEPT WOUND (IRRIGATION / IRRIGATOR) ×1 IMPLANT
MANIFOLD NEPTUNE II (INSTRUMENTS) ×3 IMPLANT
NEEDLE BIOPSY JAMSHIDI 8X6 (NEEDLE) IMPLANT
NEEDLE HYPO 25GX1X1/2 BEV (NEEDLE) ×3 IMPLANT
NS IRRIG 1000ML POUR BTL (IV SOLUTION) ×3 IMPLANT
PACK ORTHO EXTREMITY (CUSTOM PROCEDURE TRAY) ×3 IMPLANT
PAD ARMBOARD 7.5X6 YLW CONV (MISCELLANEOUS) ×6 IMPLANT
PROBE DEBRIDE SONICVAC MISONIX (TIP) IMPLANT
SET CYSTO W/LG BORE CLAMP LF (SET/KITS/TRAYS/PACK) ×3 IMPLANT
SET HNDPC FAN SPRY TIP SCT (DISPOSABLE) ×1 IMPLANT
SOL PREP POV-IOD 4OZ 10% (MISCELLANEOUS) ×6 IMPLANT
SUT ETHILON 3 0 PS 1 (SUTURE) ×9 IMPLANT
SYR CONTROL 10ML LL (SYRINGE) ×3 IMPLANT
TOWEL GREEN STERILE (TOWEL DISPOSABLE) ×3 IMPLANT
TOWEL GREEN STERILE FF (TOWEL DISPOSABLE) ×3 IMPLANT
TUBE CONNECTING 12'X1/4 (SUCTIONS) ×1
TUBE CONNECTING 12X1/4 (SUCTIONS) ×2 IMPLANT
YANKAUER SUCT BULB TIP NO VENT (SUCTIONS) ×3 IMPLANT

## 2020-12-07 NOTE — Progress Notes (Signed)
  Subjective:  Patient ID: Benjamin Russo, male    DOB: 1976/07/05,  MRN: [id number]  Patient seen bedside. Reports compliance with NPO status. Understands plan for the OR. Objective:   Vitals:   12/07/20 0743 12/07/20 1426  BP: (!) 141/80 134/72  Pulse: 80 83  Resp: 17 16  Temp: (!) 97.5 F (36.4 C) 97.8 F (36.6 C)  SpO2: 100% 99%   Dressing left intact.  Assessment & Plan:  Patient was evaluated and treated and all questions answered.  Gangrene right foot, osteomyelitis -Continue IV Abx -Continue pain management -Heel WB RLE -Plan for Debridement and irrigation with removal of all non-viable soft tissue and bone, including fourth metatarsal resection and 5th ray resection. Consent reviewed and signed. -Will continue to follow.  Park Liter, DPM  Accessible via secure chat for questions or concerns.

## 2020-12-07 NOTE — Anesthesia Preprocedure Evaluation (Addendum)
Anesthesia Evaluation  Patient identified by MRN, date of birth, ID band Patient awake    Reviewed: Allergy & Precautions, NPO status , Patient's Chart, lab work & pertinent test results  Airway Mallampati: III  TM Distance: >3 FB Neck ROM: Full    Dental  (+) Teeth Intact, Dental Advisory Given, Missing,    Pulmonary Current Smoker,    breath sounds clear to auscultation       Cardiovascular hypertension, Pt. on home beta blockers  Rhythm:Regular Rate:Normal     Neuro/Psych    GI/Hepatic   Endo/Other  diabetes, Type 2, Insulin Dependent, Oral Hypoglycemic Agents  Renal/GU      Musculoskeletal   Abdominal Normal abdominal exam  (+)   Peds  Hematology   Anesthesia Other Findings   Reproductive/Obstetrics                            Anesthesia Physical Anesthesia Plan  ASA: II  Anesthesia Plan: General   Post-op Pain Management:    Induction: Intravenous  PONV Risk Score and Plan: 2 and Dexamethasone, Midazolam and Ondansetron  Airway Management Planned: LMA  Additional Equipment: None  Intra-op Plan:   Post-operative Plan: Extubation in OR  Informed Consent: I have reviewed the patients History and Physical, chart, labs and discussed the procedure including the risks, benefits and alternatives for the proposed anesthesia with the patient or authorized representative who has indicated his/her understanding and acceptance.     Dental advisory given  Plan Discussed with: CRNA  Anesthesia Plan Comments:         Anesthesia Quick Evaluation

## 2020-12-07 NOTE — Anesthesia Procedure Notes (Signed)
Procedure Name: LMA Insertion Date/Time: 12/07/2020 6:23 PM Performed by: Elliot Dally, CRNA Pre-anesthesia Checklist: Patient identified, Emergency Drugs available, Suction available and Patient being monitored Patient Re-evaluated:Patient Re-evaluated prior to induction Oxygen Delivery Method: Circle System Utilized Preoxygenation: Pre-oxygenation with 100% oxygen Induction Type: IV induction Ventilation: Mask ventilation without difficulty LMA: LMA inserted LMA Size: 5.0 Number of attempts: 1 Airway Equipment and Method: Bite block Placement Confirmation: positive ETCO2 Tube secured with: Tape Dental Injury: Teeth and Oropharynx as per pre-operative assessment

## 2020-12-07 NOTE — Plan of Care (Signed)
  Problem: Activity: Goal: Risk for activity intolerance will decrease Outcome: Progressing   Problem: Safety: Goal: Ability to remain free from injury will improve Outcome: Progressing   

## 2020-12-07 NOTE — OR Nursing (Signed)
Pt is awake,alert and oriented.Pt and/or family verbalized understanding of poc and discharge instructions. Reviewed admission and on going care with receiving RN. Pt is in NAD at this time and is ready to be transferred to floor. Will con't to monitor until pt is transferred.  

## 2020-12-07 NOTE — Transfer of Care (Signed)
Immediate Anesthesia Transfer of Care Note  Patient: Benjamin Russo  Procedure(s) Performed: IRRIGATION AND DEBRIDEMENT, REMOVAL OF 5th TOE, 4th AND 5th METATARSAL (Right Foot)  Patient Location: PACU  Anesthesia Type:General  Level of Consciousness: drowsy  Airway & Oxygen Therapy: Patient Spontanous Breathing and Patient connected to nasal cannula oxygen  Post-op Assessment: Report given to RN and Post -op Vital signs reviewed and stable  Post vital signs: Reviewed and stable  Last Vitals:  Vitals Value Taken Time  BP 153/78 12/07/20 1940  Temp    Pulse 99 12/07/20 1944  Resp 15 12/07/20 1944  SpO2 99 % 12/07/20 1944  Vitals shown include unvalidated device data.  Last Pain:  Vitals:   12/07/20 1426  TempSrc: Oral  PainSc:       Patients Stated Pain Goal: 0 (12/05/20 1930)  Complications: No complications documented.

## 2020-12-07 NOTE — Op Note (Signed)
Patient Name: Benjamin Russo DOB: 1976-05-21  MRN: [id number]   Date of Surgery: 12/07/20  Surgeon: Dr. Ventura Sellers, DPM Assistants: Dr. Ovid Curd, DPM  Pre-operative Diagnosis:  Osteomyelitis of right foot, gangrene of toe right foot Post-operative Diagnosis:  Same plus abscess right foot Procedures:  1) Incision and drainage of complex foot abscess below the deep fascia  2) Amputation fifth ray partial  3) Metatarsectomy partial fourth metatarsal Pathology/Specimens: ID Type Source Tests Collected by Time Destination  1 : 5th Toe; 4th and 5th Metatarsal Tissue PATH Other SURGICAL PATHOLOGY Park Liter, [facility] 12/07/2020 1904    Anesthesia: LMA Hemostasis: Anatomic Estimated Blood Loss: 100 mL Materials: * No implants in log * Medications: 1g vancomycin powder topical Complications: none  Indications for Procedure:  This is Benjamin 44 y.o. male who previously underwent partial fourth ray amputation with postoperative gangrenous changes at the surgical site.  He had gangrene of the fifth toe.  MRI was suggestive of osteomyelitis of the fourth and fifth metatarsals, probable third metatarsal involvement as well.  It was discussed the patient benefit from incision and drainage with debridement as well as partial fourth metatarsal resection and partial fifth ray resection.  All risk benefits and alternatives of surgery discussed with the patient no guarantees were given   Procedure in Detail: Patient was identified in pre-operative holding area. Formal consent was signed and the right lower extremity was marked. Patient was brought back to the operating room. Anesthesia was induced. The extremity was prepped and draped in the usual sterile fashion. Timeout was taken to confirm patient name, laterality, and procedure prior to incision.   Attention was then directed to the right foot.  The fifth toe was gangrenous in appearance.  There was gross necrosis of the wound bed.  The fifth  toe was testicular the metatarsophalangeal joint with 15 blade.  The fifth metatarsal was noted to be soft and was easily punctured with Benjamin 15 blade.  The wound was sharply excisionally debrided with Benjamin rongeur to bleeding viable wound.  During this there was Benjamin abscess that tracked plantar medially to the metatarsals and to the plantar aspect of the third metatarsal.  An incision was made plantar to the fourth interspace and angled medially.  The abscess was evacuated and tissue necrosis was debrided..There was local necrosis over the third metatarsal head but the head itself appeared viable. The wound was then continually excisionally sharply debrided with Benjamin rongeur until bleeding viable tissue was noted at the abscessed area.  The wound was then irrigated with 1 L of normal saline via pulse lavage.  The wound was further debrided and irrigated again for another 2 L.  An incision was then made at the area overlying the medial aspect of the fourth metatarsal.  Benjamin full-thickness flap was raised off the metatarsals for later closure.  The fourth and fifth metatarsals were identified.  The fourth and fifth metatarsals were transected with Benjamin sagittal saw.  They were passed for pathology along with the fifth toe.  Vancomycin powder was applied topically.  Surgicel was applied to the wound base.  The wound was packed with iodoform packing material..  The foot was then dressed with Betadine, 4 x 4's, Kerlix, ABD, and Ace bandage. Patient tolerated the procedure well.   Dr. Ardelle Anton was scrubbed and present for the major portions of the procedure, and assisted in retraction and positioning.  Disposition: Following Benjamin period of post-operative monitoring, patient will be transferred back to the  floor.  He would benefit from return to the OR later date.  He would most benefit from transmetatarsal amputation however if tissues allow he may just need third metatarsal resection and closure.  We will plan for return to the  OR this weekend for debridement possible third metatarsal resection and full wound delayed closure or transmetatarsal amputation.

## 2020-12-07 NOTE — Progress Notes (Signed)
PROGRESS NOTE  Benjamin Russo GNF:[id number] DOB: 1976-12-20   PCP: Charlott Rakes, MD  Patient is from: Home  DOA: 12/04/2020 LOS: 3  Chief complaints: Right foot infection and wound  Brief Narrative / Interim history: 44 year old male with history of IDDM-2, tobacco use, noncompliance, HTN, anemia and recent right fourth toe amputation for possible gangrene about 2 weeks ago admitted from podiatry office, Dr. [name] with concern for osteomyelitis and gangrene in right fifth toe and PAD.  Patient has had discoloration and discharge from fifth toe for about a week.  Also subjective fever, chills and night sweats intermittently.  Per podiatry note, had x-ray concerning for bone erosion.  He was started on vancomycin and cefepime, and admitted. ABI with noncompressible BLE arteries and significantly reduced TBI.  Vascular surgery consulted, and recommended medical management for his PAD.  MRI of his right foot concerning for osteomyelitis in his third toe, third through fifth metatarsal bones, and diffuse soft tissue inflammation/infection in the right foot.  Plan is for surgery by podiatry  Subjective: Seen and examined earlier this morning.  No major events overnight or this morning.  No complaints.  Extensively discussed the importance of compliance with his medication, getting his diabetes and cholesterol under control, tobacco cessation and daily walking once cleared by podiatry.   Objective: Vitals:   12/06/20 1510 12/06/20 2018 12/07/20 0404 12/07/20 0743  BP: 134/66 (!) 148/73 (!) 152/68 (!) 141/80  Pulse: 79 83 84 80  Resp: 17 18 17 17   Temp: 98.4 F (36.9 C) 98.1 F (36.7 C) 98.5 F (36.9 C) (!) 97.5 F (36.4 C)  TempSrc: Oral Oral Oral Oral  SpO2: 100% 99% 100% 100%  Weight:      Height:        Intake/Output Summary (Last 24 hours) at 12/07/2020 1406 Last data filed at 12/07/2020 1200 Gross per 24 hour  Intake 240 ml  Output 300 ml  Net -60 ml   Filed Weights    12/04/20 2111  Weight: 87.3 kg    Examination:  GENERAL: No apparent distress.  Nontoxic. HEENT: MMM.  Vision and hearing grossly intact.  NECK: Supple.  No apparent JVD.  RESP:  No IWOB.  Fair aeration bilaterally. CVS:  RRR. Heart sounds normal.  ABD/GI/GU: BS+. Abd soft, NTND.  MSK/EXT:  Moves extremities. No apparent deformity.  Trace edema.  Palpable DP pulses bilaterally. SKIN: no apparent skin lesion or wound NEURO: Awake, alert and oriented appropriately.  No apparent focal neuro deficit. PSYCH: Calm. Normal affect.  Procedures:  None  Microbiology summarized: COVID-19 and influenza PCR nonreactive Blood cultures NGTD. MRSA PCR nonreactive.  Assessment & Plan: Cellulitis with possible osteomyelitis and gangrene of right fifth toe Recent right first toe amputation for possible gangrene Bilateral PAD with noncompressible arteries. -ABI with noncompressible arteries in BLE and severely reduced TBI bilaterally -Vascular surgery recommended medical management for his PAD -MRI  concerning for osteo in rt third toe, third through fifth metatarsal bones, and myositis/cellulitis. -Blood cultures NGTD. -Continue broad-spectrum antibiotic with vancomycin and cefepime -Plan for surgery by podiatry today -Continue high intensity statin.  Will start aspirin after surgery. -Discussed about importance of compliance, tobacco cessation, daily walking and diabetic control  Uncontrolled IDDM-2 with hyperglycemia: A1c 9.1%.  Seems to be on Lantus 25 units nightly and Metformin 500 mg twice daily at home. Recent Labs  Lab 12/06/20 2140 12/07/20 0013 12/07/20 0339 12/07/20 0744 12/07/20 1107  GLUCAP 195* 177* 184* 133* 93  -Continue Lantus 10  units nightly and SSI-sensitive. -Continue atorvastatin  Essential hypertension: Normotensive. -Continue home metoprolol.  Microcytic anemia: Baseline Hgb 11-12>> 8.7> 9.1.  Denies melena or hematochezia.  Anemia panel favors anemia of  chronic disease. -Continue monitoring  Tobacco use disorder: Reports smoking about 2 cigarettes a day. -Discussed the importance of tobacco cessation and encouraged -Nicotine patch as needed  Leukocytosis with bandemia: Likely due to #1. -Continue monitoring  Body mass index is 27.62 kg/m.         DVT prophylaxis:  SCD's Start: 12/06/20 2258 SCDs Start: 12/05/20 0140  Code Status: Full code Family Communication: Patient and/or RN. Available if any question.  Status is: Inpatient  Remains inpatient appropriate because:Ongoing diagnostic testing needed not appropriate for outpatient work up, IV treatments appropriate due to intensity of illness or inability to take PO and Inpatient level of care appropriate due to severity of illness   Dispo: The patient is from: Home              Anticipated d/c is to: Home              Anticipated d/c date is: 3 days              Patient currently is not medically stable to d/c.       Consultants:  Podiatry Vascular surgery   Sch Meds:  Scheduled Meds: . atorvastatin  40 mg Oral Daily  . insulin aspart  0-9 Units Subcutaneous Q4H  . insulin glargine  10 Units Subcutaneous QHS  . metoprolol succinate  25 mg Oral Daily   Continuous Infusions: . ceFEPime (MAXIPIME) IV 2 g (12/07/20 0818)  . [START ON 12/08/2020] vancomycin     PRN Meds:.acetaminophen **OR** acetaminophen  Antimicrobials: Anti-infectives (From admission, onward)   Start     Dose/Rate Route Frequency Ordered Stop   12/08/20 0600  vancomycin (VANCOCIN) IVPB 1000 mg/200 mL premix        1,000 mg 200 mL/hr over 60 Minutes Intravenous Every 12 hours 12/07/20 1154     12/05/20 1000  vancomycin (VANCOCIN) IVPB 1000 mg/200 mL premix  Status:  Discontinued        1,000 mg 200 mL/hr over 60 Minutes Intravenous Every 8 hours 12/05/20 0525 12/07/20 1154   12/05/20 1000  ceFEPIme (MAXIPIME) 2 g in sodium chloride 0.9 % 100 mL IVPB        2 g 200 mL/hr over 30 Minutes  Intravenous Every 8 hours 12/05/20 0525     12/05/20 0230  vancomycin (VANCOREADY) IVPB 1500 mg/300 mL        1,500 mg 150 mL/hr over 120 Minutes Intravenous  Once 12/05/20 0142 12/05/20 0447   12/05/20 0230  ceFEPIme (MAXIPIME) 2 g in sodium chloride 0.9 % 100 mL IVPB        2 g 200 mL/hr over 30 Minutes Intravenous  Once 12/05/20 0142 12/05/20 0314       I have personally reviewed the following labs and images: CBC: Recent Labs  Lab 12/05/20 0425 12/06/20 0327  WBC 10.3 12.2*  NEUTROABS 6.9 9.0*  HGB 8.7* 9.1*  HCT 25.5* 27.1*  MCV 73.5* 73.6*  PLT 323 332   BMP &GFR Recent Labs  Lab 12/05/20 0425 12/07/20 1025  NA 135 137  K 4.6 4.5  CL 104 105  CO2 22 22  GLUCOSE 172* 104*  BUN 23* 17  CREATININE 1.20 1.07  CALCIUM 8.9 8.9   Estimated Creatinine Clearance: 92.9 mL/min (by C-G formula based on  SCr of 1.07 mg/dL). Liver & Pancreas: Recent Labs  Lab 12/05/20 0425  AST 19  ALT 26  ALKPHOS 79  BILITOT 0.7  PROT 7.1  ALBUMIN 2.7*   No results for input(s): LIPASE, AMYLASE in the last 168 hours. No results for input(s): AMMONIA in the last 168 hours. Diabetic: Recent Labs    12/06/20 0327  HGBA1C 9.1*   Recent Labs  Lab 12/06/20 2140 12/07/20 0013 12/07/20 0339 12/07/20 0744 12/07/20 1107  GLUCAP 195* 177* 184* 133* 93   Cardiac Enzymes: No results for input(s): CKTOTAL, CKMB, CKMBINDEX, TROPONINI in the last 168 hours. No results for input(s): PROBNP in the last 8760 hours. Coagulation Profile: No results for input(s): INR, PROTIME in the last 168 hours. Thyroid Function Tests: No results for input(s): TSH, T4TOTAL, FREET4, T3FREE, THYROIDAB in the last 72 hours. Lipid Profile: Recent Labs    12/06/20 0327  CHOL 165  HDL 26*  LDLCALC 117*  TRIG 108  CHOLHDL 6.3   Anemia Panel: Recent Labs    12/06/20 0327  VITAMINB12 285  FOLATE 13.0  FERRITIN 436*  TIBC 232*  IRON 22*  RETICCTPCT 1.0   Urine analysis: No results found for:  COLORURINE, APPEARANCEUR, LABSPEC, PHURINE, GLUCOSEU, HGBUR, BILIRUBINUR, KETONESUR, PROTEINUR, UROBILINOGEN, NITRITE, LEUKOCYTESUR Sepsis Labs: Invalid input(s): PROCALCITONIN, LACTICIDVEN  Microbiology: Recent Results (from the past 240 hour(s))  Culture, blood (routine x 2)     Status: None (Preliminary result)   Collection Time: 12/05/20  4:14 AM   Specimen: BLOOD  Result Value Ref Range Status   Specimen Description BLOOD RIGHT HAND  Final   Special Requests   Final    BOTTLES DRAWN AEROBIC AND ANAEROBIC Blood Culture results may not be optimal due to an excessive volume of blood received in culture bottles   Culture   Final    NO GROWTH 2 DAYS Performed at [facility], [address], [place], [place] [address]    Report Status PENDING  Incomplete  Culture, blood (routine x 2)     Status: None (Preliminary result)   Collection Time: 12/05/20  4:25 AM   Specimen: BLOOD  Result Value Ref Range Status   Specimen Description BLOOD LEFT FOREARM  Final   Special Requests   Final    BOTTLES DRAWN AEROBIC AND ANAEROBIC Blood Culture results may not be optimal due to an excessive volume of blood received in culture bottles   Culture   Final    NO GROWTH 2 DAYS Performed at [facility], [address], [place], [place] [address]    Report Status PENDING  Incomplete  Surgical PCR screen     Status: None   Collection Time: 12/05/20  5:25 AM   Specimen: Nasal Mucosa; Nasal Swab  Result Value Ref Range Status   MRSA, PCR NEGATIVE NEGATIVE Final   Staphylococcus aureus NEGATIVE NEGATIVE Final    Comment: (NOTE) The Xpert SA Assay (FDA approved for NASAL specimens in patients 71 years of age and older), is one component of a comprehensive surveillance program. It is not intended to diagnose infection nor to guide or monitor treatment. Performed at [facility], [address], [place], [place] [address]   Resp Panel by RT-PCR (Flu A&B, Covid) Nasopharyngeal Swab      Status: None   Collection Time: 12/06/20  8:13 AM   Specimen: Nasopharyngeal Swab; Nasopharyngeal(NP) swabs in vial transport medium  Result Value Ref Range Status   SARS Coronavirus 2 by  RT PCR NEGATIVE NEGATIVE Final    Comment: (NOTE) SARS-CoV-2 target nucleic acids are NOT DETECTED.  The SARS-CoV-2 RNA is generally detectable in upper respiratory specimens during the acute phase of infection. The lowest concentration of SARS-CoV-2 viral copies this assay can detect is 138 copies/mL. A negative result does not preclude SARS-Cov-2 infection and should not be used as the sole basis for treatment or other patient management decisions. A negative result may occur with  improper specimen collection/handling, submission of specimen other than nasopharyngeal swab, presence of viral mutation(s) within the areas targeted by this assay, and inadequate number of viral copies(<138 copies/mL). A negative result must be combined with clinical observations, patient history, and epidemiological information. The expected result is Negative.  Fact Sheet for Patients:  BloggerCourse.com  Fact Sheet for Healthcare Providers:  SeriousBroker.it  This test is no t yet approved or cleared by the [place] FDA and  has been authorized for detection and/or diagnosis of SARS-CoV-2 by FDA under an Emergency Use Authorization (EUA). This EUA will remain  in effect (meaning this test can be used) for the duration of the COVID-19 declaration under Section 564(b)(1) of the Act, 21 U.S.C.section 360bbb-3(b)(1), unless the authorization is terminated  or revoked sooner.       Influenza A by PCR NEGATIVE NEGATIVE Final   Influenza B by PCR NEGATIVE NEGATIVE Final    Comment: (NOTE) The Xpert Xpress SARS-CoV-2/FLU/RSV plus assay is intended as an aid in the diagnosis of influenza from Nasopharyngeal swab specimens and should not be used as a sole basis  for treatment. Nasal washings and aspirates are unacceptable for Xpert Xpress SARS-CoV-2/FLU/RSV testing.  Fact Sheet for Patients: BloggerCourse.com  Fact Sheet for Healthcare Providers: SeriousBroker.it  This test is not yet approved or cleared by the [place] FDA and has been authorized for detection and/or diagnosis of SARS-CoV-2 by FDA under an Emergency Use Authorization (EUA). This EUA will remain in effect (meaning this test can be used) for the duration of the COVID-19 declaration under Section 564(b)(1) of the Act, 21 U.S.C. section 360bbb-3(b)(1), unless the authorization is terminated or revoked.  Performed at [facility], [address] [place], [place] [address]     Radiology Studies: No results found.   Tyree Fluharty T. Jodi Criscuolo [facility]  If 7PM-7AM, please contact night-coverage [email or web address] 12/07/2020, 2:06 PM

## 2020-12-07 NOTE — Progress Notes (Signed)
Pharmacy Antibiotic Note  Benjamin Russo is a 44 y.o. male admitted on 12/04/2020 with RLE gangrene/osteomyelitis. Planned partial 4th-5th toe amputation 12/10. Pharmacy has been consulted for Vancomycin dosing.  12/9 VT drawn 7.5hr after dose = 28, supratherapeutic. RN hung next dose already, received about half. Asked RN to stop vancomycin. Skip one dose and reduce to 1000 Q12 hr   Plan: Reduce vancomycin to 1000mg  Q12 hr starting at 6am  Cefepime 2 g IV q8h  Monitor cultures, clinical status, renal fx Narrow abx as able and f/u duration   Height: 5\' 10"  (177.8 cm) Weight: 87.3 kg (192 lb 7.4 oz) IBW/kg (Calculated) : 73  Temp (24hrs), Avg:98.1 F (36.7 C), Min:97.5 F (36.4 C), Max:98.5 F (36.9 C)  Recent Labs  Lab 12/05/20 0425 12/06/20 0327 12/07/20 1025  WBC 10.3 12.2*  --   CREATININE 1.20  --  1.07  VANCOTROUGH  --   --  28*    Estimated Creatinine Clearance: 92.9 mL/min (by C-G formula based on SCr of 1.07 mg/dL).    No Known Allergies  Antimicrobials this admit: Cefepime 12/7 >>  Vanc 12/7 >>    Dose changes this admisstion  12/9 VR drawn 7.5hr from dose =28, decr to 1g Q 12hr  Microbiology 12/7 Bcx: NGTD 12/7 MRSA neg   14/7, PharmD, BCPS, BCCP Clinical Pharmacist  Please check AMION for all [facility] Pharmacy phone numbers After 10:00 PM, call Main Pharmacy [phone]

## 2020-12-07 NOTE — Anesthesia Postprocedure Evaluation (Signed)
Anesthesia Post Note  Patient: Benjamin Russo  Procedure(s) Performed: IRRIGATION AND DEBRIDEMENT, REMOVAL OF 5th TOE, 4th AND 5th METATARSAL (Right Foot)     Patient location during evaluation: PACU Anesthesia Type: General Level of consciousness: awake and alert Pain management: pain level controlled Vital Signs Assessment: post-procedure vital signs reviewed and stable Respiratory status: spontaneous breathing, nonlabored ventilation and respiratory function stable Cardiovascular status: blood pressure returned to baseline and stable Postop Assessment: no apparent nausea or vomiting Anesthetic complications: no   No complications documented.  Last Vitals:  Vitals:   12/07/20 2015 12/07/20 2052  BP: (!) 169/80 (!) 152/74  Pulse: 92 94  Resp: 19 17  Temp:  (!) 36.3 C  SpO2: 99% 98%    Last Pain:  Vitals:   12/07/20 2052  TempSrc: Oral  PainSc:                  Beryle Lathe

## 2020-12-08 ENCOUNTER — Encounter (HOSPITAL_COMMUNITY): Payer: Self-pay | Admitting: Podiatry

## 2020-12-08 LAB — CBC WITH DIFFERENTIAL/PLATELET
Abs Immature Granulocytes: 0.05 10*3/uL (ref 0.00–0.07)
Basophils Absolute: 0 10*3/uL (ref 0.0–0.1)
Basophils Relative: 0 %
Eosinophils Absolute: 0 10*3/uL (ref 0.0–0.5)
Eosinophils Relative: 0 %
HCT: 26.2 % — ABNORMAL LOW (ref 39.0–52.0)
Hemoglobin: 8.5 g/dL — ABNORMAL LOW (ref 13.0–17.0)
Immature Granulocytes: 0 %
Lymphocytes Relative: 5 %
Lymphs Abs: 0.6 10*3/uL — ABNORMAL LOW (ref 0.7–4.0)
MCH: 24.1 pg — ABNORMAL LOW (ref 26.0–34.0)
MCHC: 32.4 g/dL (ref 30.0–36.0)
MCV: 74.4 fL — ABNORMAL LOW (ref 80.0–100.0)
Monocytes Absolute: 0.6 10*3/uL (ref 0.1–1.0)
Monocytes Relative: 5 %
Neutro Abs: 11.2 10*3/uL — ABNORMAL HIGH (ref 1.7–7.7)
Neutrophils Relative %: 90 %
Platelets: 309 10*3/uL (ref 150–400)
RBC: 3.52 MIL/uL — ABNORMAL LOW (ref 4.22–5.81)
RDW: 13.2 % (ref 11.5–15.5)
WBC: 12.4 10*3/uL — ABNORMAL HIGH (ref 4.0–10.5)
nRBC: 0 % (ref 0.0–0.2)

## 2020-12-08 LAB — BASIC METABOLIC PANEL
Anion gap: 10 (ref 5–15)
BUN: 21 mg/dL — ABNORMAL HIGH (ref 6–20)
CO2: 22 mmol/L (ref 22–32)
Calcium: 8.4 mg/dL — ABNORMAL LOW (ref 8.9–10.3)
Chloride: 100 mmol/L (ref 98–111)
Creatinine, Ser: 1.31 mg/dL — ABNORMAL HIGH (ref 0.61–1.24)
GFR, Estimated: >60 mL/min (ref >60)
Glucose, Bld: 378 mg/dL — ABNORMAL HIGH (ref 70–99)
Potassium: 4.6 mmol/L (ref 3.5–5.1)
Sodium: 132 mmol/L — ABNORMAL LOW (ref 135–145)

## 2020-12-08 LAB — RENAL FUNCTION PANEL
Albumin: 2.5 g/dL — ABNORMAL LOW (ref 3.5–5.0)
Anion gap: 12 (ref 5–15)
BUN: 22 mg/dL — ABNORMAL HIGH (ref 6–20)
CO2: 21 mmol/L — ABNORMAL LOW (ref 22–32)
Calcium: 8.6 mg/dL — ABNORMAL LOW (ref 8.9–10.3)
Chloride: 99 mmol/L (ref 98–111)
Creatinine, Ser: 1.31 mg/dL — ABNORMAL HIGH (ref 0.61–1.24)
GFR, Estimated: >60 mL/min (ref >60)
Glucose, Bld: 427 mg/dL — ABNORMAL HIGH (ref 70–99)
Phosphorus: 2.2 mg/dL — ABNORMAL LOW (ref 2.5–4.6)
Potassium: 4.5 mmol/L (ref 3.5–5.1)
Sodium: 132 mmol/L — ABNORMAL LOW (ref 135–145)

## 2020-12-08 LAB — GLUCOSE, CAPILLARY
Glucose-Capillary: 188 mg/dL — ABNORMAL HIGH (ref 70–99)
Glucose-Capillary: 199 mg/dL — ABNORMAL HIGH (ref 70–99)
Glucose-Capillary: 215 mg/dL — ABNORMAL HIGH (ref 70–99)
Glucose-Capillary: 361 mg/dL — ABNORMAL HIGH (ref 70–99)
Glucose-Capillary: 404 mg/dL — ABNORMAL HIGH (ref 70–99)
Glucose-Capillary: 491 mg/dL — ABNORMAL HIGH (ref 70–99)

## 2020-12-08 LAB — MAGNESIUM: Magnesium: 1.8 mg/dL (ref 1.7–2.4)

## 2020-12-08 MED ORDER — FENTANYL CITRATE (PF) 100 MCG/2ML IJ SOLN
25.0000 ug | Freq: Once | INTRAMUSCULAR | Status: AC
  Administered 2020-12-08: 25 ug via INTRAVENOUS
  Filled 2020-12-08: qty 2

## 2020-12-08 MED ORDER — INSULIN ASPART 100 UNIT/ML ~~LOC~~ SOLN: 11.0000 [IU] | Freq: Once | SUBCUTANEOUS | Status: DC

## 2020-12-08 MED ORDER — INSULIN GLARGINE 100 UNIT/ML ~~LOC~~ SOLN
10.0000 [IU] | Freq: Two times a day (BID) | SUBCUTANEOUS | Status: DC
  Administered 2020-12-08 – 2020-12-15 (×15): 10 [IU] via SUBCUTANEOUS
  Filled 2020-12-08 (×17): qty 0.1

## 2020-12-08 MED ORDER — INSULIN ASPART 100 UNIT/ML ~~LOC~~ SOLN
7.0000 [IU] | Freq: Once | SUBCUTANEOUS | Status: AC
  Administered 2020-12-08: 7 [IU] via SUBCUTANEOUS

## 2020-12-08 MED ORDER — INSULIN ASPART 100 UNIT/ML ~~LOC~~ SOLN
0.0000 [IU] | Freq: Every day | SUBCUTANEOUS | Status: DC
  Administered 2020-12-10: 22:00:00 2 [IU] via SUBCUTANEOUS

## 2020-12-08 MED ORDER — INSULIN ASPART 100 UNIT/ML ~~LOC~~ SOLN
0.0000 [IU] | Freq: Three times a day (TID) | SUBCUTANEOUS | Status: DC
  Administered 2020-12-08: 3 [IU] via SUBCUTANEOUS
  Administered 2020-12-08: 5 [IU] via SUBCUTANEOUS
  Administered 2020-12-09 (×2): 3 [IU] via SUBCUTANEOUS
  Administered 2020-12-10 (×2): 2 [IU] via SUBCUTANEOUS
  Administered 2020-12-11 – 2020-12-12 (×3): 3 [IU] via SUBCUTANEOUS

## 2020-12-08 NOTE — Plan of Care (Signed)
  Problem: Education: Goal: Knowledge of General Education information will improve Description: Including pain rating scale, medication(s)/side effects and non-pharmacologic comfort measures Outcome: Progressing   Problem: Clinical Measurements: Goal: Will remain free from infection Outcome: Progressing Goal: Diagnostic test results will improve Outcome: Progressing   

## 2020-12-08 NOTE — Progress Notes (Signed)
PROGRESS NOTE  Benjamin Russo ZHY:[id number] DOB: 02/16/1976   PCP: Charlott RakesHodges, [place], MD  Patient is from: Home  DOA: 12/04/2020 LOS: 4  Chief complaints: Right foot infection and wound  Brief Narrative / Interim history: 44 year old male with history of IDDM-2, tobacco use, noncompliance, HTN, anemia and recent right fourth toe amputation for possible gangrene about 2 weeks ago admitted from podiatry office, Dr. Samuella CotaPrice with concern for osteomyelitis and gangrene in right fifth toe and PAD.  Patient has had discoloration and discharge from fifth toe for about a week.  Also subjective fever, chills and night sweats intermittently.  Per podiatry note, had x-ray concerning for bone erosion.  He was started on vancomycin and cefepime, and admitted. ABI with noncompressible BLE arteries and significantly reduced TBI.  Vascular surgery consulted, and recommended medical management for his PAD.  MRI of his right foot concerning for osteomyelitis in his third toe, third through fifth metatarsal bones, and diffuse soft tissue inflammation/infection in the right foot.  Patient underwent I&D, fifth ray partial amputation and metatarsectomy fourth partial metatarsal by Dr. Samuella CotaPrice on 12/07/2020.  Plan is to return to the OR on 12/09/2020 for delayed wound closure, source of metatarsal resection.  Remains on broad-spectrum IV antibiotics.  Subjective: Seen and examined earlier this morning.  No major events overnight of this morning.  Denies pain in his foot.  No complaints.  Significant hypoglycemia overnight.  Received NovoLog 11 units earlier in the morning.  Objective: Vitals:   12/07/20 2052 12/08/20 0029 12/08/20 0508 12/08/20 0749  BP: (!) 152/74 138/67 (!) 132/57 (!) 149/75  Pulse: 94 99 89 82  Resp: 17 16 16 17   Temp: (!) 97.3 F (36.3 C) 98.4 F (36.9 C) 98.2 F (36.8 C) 98.3 F (36.8 C)  TempSrc: Oral Oral Oral Oral  SpO2: 98% 96% 90% 95%  Weight:      Height:        Intake/Output  Summary (Last 24 hours) at 12/08/2020 1426 Last data filed at 12/08/2020 0801 Gross per 24 hour  Intake 933.1 ml  Output 1000 ml  Net -66.9 ml   Filed Weights   12/04/20 2111 12/07/20 1758  Weight: 87.3 kg 87.3 kg    Examination:  GENERAL: No apparent distress.  Nontoxic. HEENT: MMM.  Vision and hearing grossly intact.  NECK: Supple.  No apparent JVD.  RESP:  No IWOB.  Fair aeration bilaterally. CVS:  RRR. Heart sounds normal.  ABD/GI/GU: BS+. Abd soft, NTND.  MSK/EXT:  Moves extremities.  Ace wrap over right foot SKIN: Ace wrap over right foot. NEURO: Awake, alert and oriented appropriately.  No apparent focal neuro deficit. PSYCH: Calm. Normal affect.   Procedures:  12/07/2020-I&D, fifth ray partial amputation and metatarsectomy fourth partial metatarsal by Dr. Samuella CotaPrice  Microbiology summarized: COVID-19 and influenza PCR nonreactive Blood cultures NGTD. MRSA PCR nonreactive.  Assessment & Plan: Cellulitis with possible osteomyelitis and gangrene of right fifth toe Recent right first toe amputation for possible gangrene Bilateral PAD with noncompressible arteries. -ABI with noncompressible arteries in BLE and severely reduced TBI bilaterally -Vascular surgery recommended medical management for his PAD -MRI  concerning for osteo in rt third toe, third through fifth metatarsal bones, and myositis/cellulitis. -Blood cultures NGTD. -S/p I&D, fifth ray partial amputation and metatarsectomy fourth partial metatarsal by Dr. Samuella CotaPrice on 12/9 -Continue broad-spectrum antibiotic with vancomycin and cefepime -Plan for RTOR on 12/11 for third metatarsal resection and delayed wound closure -Continue high intensity statin.  Will start aspirin after surgery. -  Discussed about importance of compliance, tobacco cessation, daily walking and diabetic control  Uncontrolled IDDM-2 with hyperglycemia: A1c 9.1%.  Seems to be on Lantus 25 units nightly and Metformin 500 mg twice daily at  home. Recent Labs  Lab 12/07/20 2104 12/08/20 0031 12/08/20 0432 12/08/20 0746 12/08/20 1201  GLUCAP 153* 404* 491* 361* 215*  -Increase Lantus to 10 units twice daily -Increase SSI to moderate -Further adjustment as appropriate -Continue atorvastatin  Essential hypertension: Normotensive. -Continue home metoprolol.  Microcytic anemia: Baseline Hgb 11-12>> 8.7> 9.1> 8.5.  Denies melena or hematochezia.  Anemia panel favors anemia of chronic disease. -Continue monitoring  Tobacco use disorder: Reports smoking about 2 cigarettes a day. -Discussed the importance of tobacco cessation and encouraged -Nicotine patch as needed  Hyponatremia: Na 132 but corrects to normal for hyperglycemia. -Manage hyperglycemia as above.  Leukocytosis with bandemia: Likely due to #1. -Continue monitoring  Body mass index is 27.62 kg/m.         DVT prophylaxis:  SCDs Start: 12/05/20 0140  Code Status: Full code Family Communication: Patient and/or RN. Available if any question.  Status is: Inpatient  Remains inpatient appropriate because:Ongoing diagnostic testing needed not appropriate for outpatient work up, IV treatments appropriate due to intensity of illness or inability to take PO and Inpatient level of care appropriate due to severity of illness   Dispo: The patient is from: Home              Anticipated d/c is to: Home              Anticipated d/c date is: 3 days              Patient currently is not medically stable to d/c.       Consultants:  Podiatry-following Vascular surgery-signed off   Sch Meds:  Scheduled Meds: . atorvastatin  40 mg Oral Daily  . insulin aspart  0-15 Units Subcutaneous TID WC  . insulin aspart  0-5 Units Subcutaneous QHS  . insulin aspart  11 Units Subcutaneous Once  . insulin glargine  10 Units Subcutaneous BID  . metoprolol succinate  25 mg Oral Daily   Continuous Infusions: . ceFEPime (MAXIPIME) IV 2 g (12/08/20 1034)  . vancomycin  1,000 mg (12/08/20 0557)   PRN Meds:.acetaminophen **OR** acetaminophen  Antimicrobials: Anti-infectives (From admission, onward)   Start     Dose/Rate Route Frequency Ordered Stop   12/08/20 0600  vancomycin (VANCOCIN) IVPB 1000 mg/200 mL premix        1,000 mg 200 mL/hr over 60 Minutes Intravenous Every 12 hours 12/07/20 1154     12/07/20 1857  vancomycin (VANCOCIN) powder  Status:  Discontinued          As needed 12/07/20 1900 12/07/20 1935   12/05/20 1000  vancomycin (VANCOCIN) IVPB 1000 mg/200 mL premix  Status:  Discontinued        1,000 mg 200 mL/hr over 60 Minutes Intravenous Every 8 hours 12/05/20 0525 12/07/20 1154   12/05/20 1000  ceFEPIme (MAXIPIME) 2 g in sodium chloride 0.9 % 100 mL IVPB        2 g 200 mL/hr over 30 Minutes Intravenous Every 8 hours 12/05/20 0525     12/05/20 0230  vancomycin (VANCOREADY) IVPB 1500 mg/300 mL        1,500 mg 150 mL/hr over 120 Minutes Intravenous  Once 12/05/20 0142 12/05/20 0447   12/05/20 0230  ceFEPIme (MAXIPIME) 2 g in sodium chloride 0.9 % 100 mL  IVPB        2 g 200 mL/hr over 30 Minutes Intravenous  Once 12/05/20 0142 12/05/20 0314       I have personally reviewed the following labs and images: CBC: Recent Labs  Lab 12/05/20 0425 12/06/20 0327 12/08/20 0350  WBC 10.3 12.2* 12.4*  NEUTROABS 6.9 9.0* 11.2*  HGB 8.7* 9.1* 8.5*  HCT 25.5* 27.1* 26.2*  MCV 73.5* 73.6* 74.4*  PLT 323 332 309   BMP &GFR Recent Labs  Lab 12/05/20 0425 12/07/20 1025 12/08/20 0350 12/08/20 0556  NA 135 137 132* 132*  K 4.6 4.5 4.5 4.6  CL 104 105 99 100  CO2 22 22 21* 22  GLUCOSE 172* 104* 427* 378*  BUN 23* 17 22* 21*  CREATININE 1.20 1.07 1.31* 1.31*  CALCIUM 8.9 8.9 8.6* 8.4*  MG  --   --  1.8  --   PHOS  --   --  2.2*  --    Estimated Creatinine Clearance: 75.8 mL/min (A) (by C-G formula based on SCr of 1.31 mg/dL (H)). Liver & Pancreas: Recent Labs  Lab 12/05/20 0425 12/08/20 0350  AST 19  --   ALT 26  --   ALKPHOS 79   --   BILITOT 0.7  --   PROT 7.1  --   ALBUMIN 2.7* 2.5*   No results for input(s): LIPASE, AMYLASE in the last 168 hours. No results for input(s): AMMONIA in the last 168 hours. Diabetic: Recent Labs    12/06/20 0327  HGBA1C 9.1*   Recent Labs  Lab 12/07/20 2104 12/08/20 0031 12/08/20 0432 12/08/20 0746 12/08/20 1201  GLUCAP 153* 404* 491* 361* 215*   Cardiac Enzymes: No results for input(s): CKTOTAL, CKMB, CKMBINDEX, TROPONINI in the last 168 hours. No results for input(s): PROBNP in the last 8760 hours. Coagulation Profile: No results for input(s): INR, PROTIME in the last 168 hours. Thyroid Function Tests: No results for input(s): TSH, T4TOTAL, FREET4, T3FREE, THYROIDAB in the last 72 hours. Lipid Profile: Recent Labs    12/06/20 0327  CHOL 165  HDL 26*  LDLCALC 117*  TRIG 108  CHOLHDL 6.3   Anemia Panel: Recent Labs    12/06/20 0327  VITAMINB12 285  FOLATE 13.0  FERRITIN 436*  TIBC 232*  IRON 22*  RETICCTPCT 1.0   Urine analysis: No results found for: COLORURINE, APPEARANCEUR, LABSPEC, PHURINE, GLUCOSEU, HGBUR, BILIRUBINUR, KETONESUR, PROTEINUR, UROBILINOGEN, NITRITE, LEUKOCYTESUR Sepsis Labs: Invalid input(s): PROCALCITONIN, LACTICIDVEN  Microbiology: Recent Results (from the past 240 hour(s))  Culture, blood (routine x 2)     Status: None (Preliminary result)   Collection Time: 12/05/20  4:14 AM   Specimen: BLOOD  Result Value Ref Range Status   Specimen Description BLOOD RIGHT HAND  Final   Special Requests   Final    BOTTLES DRAWN AEROBIC AND ANAEROBIC Blood Culture results may not be optimal due to an excessive volume of blood received in culture bottles   Culture   Final    NO GROWTH 3 DAYS Performed at [facility], [address], [place], [place] [address]    Report Status PENDING  Incomplete  Culture, blood (routine x 2)     Status: None (Preliminary result)   Collection Time: 12/05/20  4:25 AM   Specimen: BLOOD  Result Value  Ref Range Status   Specimen Description BLOOD LEFT FOREARM  Final   Special Requests   Final    BOTTLES DRAWN AEROBIC AND ANAEROBIC Blood Culture results  may not be optimal due to an excessive volume of blood received in culture bottles   Culture   Final    NO GROWTH 3 DAYS Performed at [facility], [address] [place], [place] [address]    Report Status PENDING  Incomplete  Surgical PCR screen     Status: None   Collection Time: 12/05/20  5:25 AM   Specimen: Nasal Mucosa; Nasal Swab  Result Value Ref Range Status   MRSA, PCR NEGATIVE NEGATIVE Final   Staphylococcus aureus NEGATIVE NEGATIVE Final    Comment: (NOTE) The Xpert SA Assay (FDA approved for NASAL specimens in patients 65 years of age and older), is one component of a comprehensive surveillance program. It is not intended to diagnose infection nor to guide or monitor treatment. Performed at [facility], [address] [place], [place] [address]   Resp Panel by RT-PCR (Flu A&B, Covid) Nasopharyngeal Swab     Status: None   Collection Time: 12/06/20  8:13 AM   Specimen: Nasopharyngeal Swab; Nasopharyngeal(NP) swabs in vial transport medium  Result Value Ref Range Status   SARS Coronavirus 2 by RT PCR NEGATIVE NEGATIVE Final    Comment: (NOTE) SARS-CoV-2 target nucleic acids are NOT DETECTED.  The SARS-CoV-2 RNA is generally detectable in upper respiratory specimens during the acute phase of infection. The lowest concentration of SARS-CoV-2 viral copies this assay can detect is 138 copies/mL. A negative result does not preclude SARS-Cov-2 infection and should not be used as the sole basis for treatment or other patient management decisions. A negative result may occur with  improper specimen collection/handling, submission of specimen other than nasopharyngeal swab, presence of viral mutation(s) within the areas targeted by this assay, and inadequate number of viral copies(<138 copies/mL). A negative  result must be combined with clinical observations, patient history, and epidemiological information. The expected result is Negative.  Fact Sheet for Patients:  BloggerCourse.com  Fact Sheet for Healthcare Providers:  SeriousBroker.it  This test is no t yet approved or cleared by the [place] FDA and  has been authorized for detection and/or diagnosis of SARS-CoV-2 by FDA under an Emergency Use Authorization (EUA). This EUA will remain  in effect (meaning this test can be used) for the duration of the COVID-19 declaration under Section 564(b)(1) of the Act, 21 U.S.C.section 360bbb-3(b)(1), unless the authorization is terminated  or revoked sooner.       Influenza A by PCR NEGATIVE NEGATIVE Final   Influenza B by PCR NEGATIVE NEGATIVE Final    Comment: (NOTE) The Xpert Xpress SARS-CoV-2/FLU/RSV plus assay is intended as an aid in the diagnosis of influenza from Nasopharyngeal swab specimens and should not be used as a sole basis for treatment. Nasal washings and aspirates are unacceptable for Xpert Xpress SARS-CoV-2/FLU/RSV testing.  Fact Sheet for Patients: BloggerCourse.com  Fact Sheet for Healthcare Providers: SeriousBroker.it  This test is not yet approved or cleared by the [place] FDA and has been authorized for detection and/or diagnosis of SARS-CoV-2 by FDA under an Emergency Use Authorization (EUA). This EUA will remain in effect (meaning this test can be used) for the duration of the COVID-19 declaration under Section 564(b)(1) of the Act, 21 U.S.C. section 360bbb-3(b)(1), unless the authorization is terminated or revoked.  Performed at [facility], [address] [place], [place] [address]     Radiology Studies: DG Foot 2 Views Right  Result Date: 12/07/2020 CLINICAL DATA:  Postop. EXAM: RIGHT FOOT - 2  VIEW COMPARISON:  December 04, 2020  FINDINGS: The patient has undergone transmetatarsal amputation of the fourth and fifth digits. There are expected postsurgical changes including subcutaneous gas and overlying soft tissue edema. There is soft tissue swelling about the first digit with a questionable ulceration. Vascular calcifications are noted. IMPRESSION: 1. Status post transmetatarsal amputation of the fourth and fifth digits. 2. Soft tissue swelling about the first digit with a questionable ulceration. Electronically Signed   By: Katherine Mantle M.D.   On: 12/07/2020 20:04     Jeroline Wolbert T. Kamdyn Covel [facility]  If 7PM-7AM, please contact night-coverage [email or web address] 12/08/2020, 2:26 PM

## 2020-12-08 NOTE — Plan of Care (Signed)

## 2020-12-08 NOTE — Progress Notes (Signed)
  Subjective:  Patient ID: Benjamin Russo, male    DOB: September 15, 1976,  MRN: [id number]  Patient seen bedside. Had a little bit of pain yesterday, otherwise no new complaints. Objective:   Vitals:   12/08/20 0508 12/08/20 0749  BP: (!) 132/57 (!) 149/75  Pulse: 89 82  Resp: 16 17  Temp: 98.2 F (36.8 C) 98.3 F (36.8 C)  SpO2: 90% 95%   Wound RLE healing well, only small areas of continued necrosis. Wound margins viable and appear well perfused. Sanguinous drainage noted but no active bleeding. No warmth, erythema noted.  Assessment & Plan:  Patient was evaluated and treated and all questions answered.  Gangrene right foot, osteomyelitis -Continue IV Abx -Continue pain management -Heel WB RLE -Wound dressed with DSD, no packing available at time of visit. Nursing to repack the wound. -Discussed TMA vs 3rd metatarsal resection and closure. Patient would like salvage if possible. I think further salvage with 3rd met resection is reasonable. He will likely need extended IV abx after this, however. -Plan for planned RTOR tomorrow for delayed wound closure, 3rd metatarsal resection. Will likely be ok for d/c Monday. -Will continue to follow.  Evelina Bucy, DPM  Accessible via secure chat for questions or concerns.

## 2020-12-08 NOTE — Progress Notes (Addendum)
12/10 0041  Pt CBG 404mg /dL. Pt showed no signs and symptoms. Pt alert and oriented. Pt observed to be on his phone. M. Denny,APP was notified.   12/10 0050 Orders for insulin put in. RN will continue to monitor pt.  12/10 1:10 M. 14/10 notified about Prn medication for Pt.   12/10 1:20 Order for pain meds put in.  12/10 0437 M. Denny,APP notified of Pt's CBG of 491mg /dL. Pt is stable. Pt showed no sign and symptoms. Pt stated "I don't feel anything different, I'm Ok". RN will continue to monitor Pt.  12/10 M. Denny,APP notified of Pt blood glucose of 378mg /L.Pt sable. RN will continue to monitor Pt.

## 2020-12-09 ENCOUNTER — Inpatient Hospital Stay (HOSPITAL_COMMUNITY): Payer: Commercial Managed Care - PPO | Admitting: Certified Registered Nurse Anesthetist

## 2020-12-09 ENCOUNTER — Encounter (HOSPITAL_COMMUNITY)
Admission: AD | Disposition: A | Discharge status: Home Health | Payer: Self-pay | Source: Ambulatory Visit | Attending: Student

## 2020-12-09 ENCOUNTER — Inpatient Hospital Stay (HOSPITAL_COMMUNITY): Payer: Commercial Managed Care - PPO

## 2020-12-09 ENCOUNTER — Encounter (HOSPITAL_COMMUNITY): Payer: Self-pay | Admitting: Internal Medicine

## 2020-12-09 DIAGNOSIS — L97514 Non-pressure chronic ulcer of other part of right foot with necrosis of bone: Secondary | ICD-10-CM

## 2020-12-09 DIAGNOSIS — M86171 Other acute osteomyelitis, right ankle and foot: Secondary | ICD-10-CM

## 2020-12-09 HISTORY — PX: WOUND DEBRIDEMENT: SHX247

## 2020-12-09 HISTORY — PX: METATARSAL HEAD EXCISION: SHX5027

## 2020-12-09 LAB — RENAL FUNCTION PANEL
Albumin: 2.4 g/dL — ABNORMAL LOW (ref 3.5–5.0)
Anion gap: 8 (ref 5–15)
BUN: 19 mg/dL (ref 6–20)
CO2: 23 mmol/L (ref 22–32)
Calcium: 8.6 mg/dL — ABNORMAL LOW (ref 8.9–10.3)
Chloride: 106 mmol/L (ref 98–111)
Creatinine, Ser: 0.98 mg/dL (ref 0.61–1.24)
GFR, Estimated: >60 mL/min (ref >60)
Glucose, Bld: 193 mg/dL — ABNORMAL HIGH (ref 70–99)
Phosphorus: 2.9 mg/dL (ref 2.5–4.6)
Potassium: 4.4 mmol/L (ref 3.5–5.1)
Sodium: 137 mmol/L (ref 135–145)

## 2020-12-09 LAB — GLUCOSE, CAPILLARY
Glucose-Capillary: 181 mg/dL — ABNORMAL HIGH (ref 70–99)
Glucose-Capillary: 186 mg/dL — ABNORMAL HIGH (ref 70–99)
Glucose-Capillary: 165 mg/dL — ABNORMAL HIGH (ref 70–99)
Glucose-Capillary: 108 mg/dL — ABNORMAL HIGH (ref 70–99)
Glucose-Capillary: 105 mg/dL — ABNORMAL HIGH (ref 70–99)
Glucose-Capillary: 158 mg/dL — ABNORMAL HIGH (ref 70–99)
Glucose-Capillary: 165 mg/dL — ABNORMAL HIGH (ref 70–99)

## 2020-12-09 LAB — CBC
HCT: 23 % — ABNORMAL LOW (ref 39.0–52.0)
Hemoglobin: 7.9 g/dL — ABNORMAL LOW (ref 13.0–17.0)
MCH: 25.2 pg — ABNORMAL LOW (ref 26.0–34.0)
MCHC: 34.3 g/dL (ref 30.0–36.0)
MCV: 73.2 fL — ABNORMAL LOW (ref 80.0–100.0)
Platelets: 323 10*3/uL (ref 150–400)
RBC: 3.14 MIL/uL — ABNORMAL LOW (ref 4.22–5.81)
RDW: 13.4 % (ref 11.5–15.5)
WBC: 11.6 10*3/uL — ABNORMAL HIGH (ref 4.0–10.5)
nRBC: 0 % (ref 0.0–0.2)

## 2020-12-09 LAB — MAGNESIUM: Magnesium: 1.9 mg/dL (ref 1.7–2.4)

## 2020-12-09 SURGERY — DEBRIDEMENT, WOUND
Anesthesia: Monitor Anesthesia Care | Site: Toe | Laterality: Right

## 2020-12-09 MED ORDER — PHENYLEPHRINE 40 MCG/ML (10ML) SYRINGE FOR IV PUSH (FOR BLOOD PRESSURE SUPPORT)
PREFILLED_SYRINGE | INTRAVENOUS | Status: AC
  Filled 2020-12-09: qty 10

## 2020-12-09 MED ORDER — BUPIVACAINE-EPINEPHRINE 0.5% -1:200000 IJ SOLN
INTRAMUSCULAR | Status: AC
  Filled 2020-12-09: qty 1

## 2020-12-09 MED ORDER — ONDANSETRON HCL 4 MG/2ML IJ SOLN
INTRAMUSCULAR | Status: DC | PRN
  Administered 2020-12-09: 4 mg via INTRAVENOUS

## 2020-12-09 MED ORDER — FENTANYL CITRATE (PF) 250 MCG/5ML IJ SOLN
INTRAMUSCULAR | Status: AC
  Filled 2020-12-09: qty 5

## 2020-12-09 MED ORDER — 0.9 % SODIUM CHLORIDE (POUR BTL) OPTIME
TOPICAL | Status: DC | PRN
  Administered 2020-12-09: 10:00:00 1000 mL

## 2020-12-09 MED ORDER — CHLORHEXIDINE GLUCONATE 0.12 % MT SOLN: 15.0000 mL | Freq: Once | OROMUCOSAL | Status: AC

## 2020-12-09 MED ORDER — CHLORHEXIDINE GLUCONATE 0.12 % MT SOLN
OROMUCOSAL | Status: AC
  Administered 2020-12-09: 09:00:00 15 mL via OROMUCOSAL
  Filled 2020-12-09: qty 15

## 2020-12-09 MED ORDER — SODIUM CHLORIDE 0.9 % IR SOLN
Status: DC | PRN
  Administered 2020-12-09: 6000 mL

## 2020-12-09 MED ORDER — MIDAZOLAM HCL 2 MG/2ML IJ SOLN
INTRAMUSCULAR | Status: DC | PRN
  Administered 2020-12-09: 2 mg via INTRAVENOUS

## 2020-12-09 MED ORDER — PROPOFOL 500 MG/50ML IV EMUL
INTRAVENOUS | Status: DC | PRN
  Administered 2020-12-09: 100 ug/kg/min via INTRAVENOUS

## 2020-12-09 MED ORDER — LACTATED RINGERS IV SOLN: INTRAVENOUS | Status: DC

## 2020-12-09 MED ORDER — PHENYLEPHRINE 40 MCG/ML (10ML) SYRINGE FOR IV PUSH (FOR BLOOD PRESSURE SUPPORT)
PREFILLED_SYRINGE | INTRAVENOUS | Status: DC | PRN
  Administered 2020-12-09: 80 ug via INTRAVENOUS
  Administered 2020-12-09: 40 ug via INTRAVENOUS
  Administered 2020-12-09: 80 ug via INTRAVENOUS

## 2020-12-09 MED ORDER — FENTANYL CITRATE (PF) 100 MCG/2ML IJ SOLN
100.0000 ug | Freq: Once | INTRAMUSCULAR | Status: AC
Start: 2020-12-09 — End: 2020-12-09
  Administered 2020-12-09: 11:00:00 100 ug via INTRAVENOUS

## 2020-12-09 MED ORDER — DEXAMETHASONE SODIUM PHOSPHATE 10 MG/ML IJ SOLN
INTRAMUSCULAR | Status: AC
  Filled 2020-12-09: qty 1

## 2020-12-09 MED ORDER — HYDROMORPHONE HCL 2 MG/ML IJ SOLN
2.0000 mg | Freq: Once | INTRAMUSCULAR | Status: DC
  Filled 2020-12-09: qty 1

## 2020-12-09 MED ORDER — FENTANYL CITRATE (PF) 250 MCG/5ML IJ SOLN
INTRAMUSCULAR | Status: DC | PRN
  Administered 2020-12-09 (×2): 25 ug via INTRAVENOUS

## 2020-12-09 MED ORDER — BUPIVACAINE HCL (PF) 0.5 % IJ SOLN
INTRAMUSCULAR | Status: AC
  Filled 2020-12-09: qty 30

## 2020-12-09 MED ORDER — ONDANSETRON HCL 4 MG/2ML IJ SOLN
INTRAMUSCULAR | Status: AC
  Filled 2020-12-09: qty 2

## 2020-12-09 MED ORDER — BUPIVACAINE HCL (PF) 0.5 % IJ SOLN
INTRAMUSCULAR | Status: DC | PRN
  Administered 2020-12-09: 10 mL

## 2020-12-09 MED ORDER — PROPOFOL 10 MG/ML IV BOLUS
INTRAVENOUS | Status: DC | PRN
  Administered 2020-12-09: 30 mg via INTRAVENOUS

## 2020-12-09 MED ORDER — FENTANYL CITRATE (PF) 100 MCG/2ML IJ SOLN
INTRAMUSCULAR | Status: AC
  Filled 2020-12-09: qty 2

## 2020-12-09 MED ORDER — MIDAZOLAM HCL 2 MG/2ML IJ SOLN
INTRAMUSCULAR | Status: AC
  Filled 2020-12-09: qty 2

## 2020-12-09 SURGICAL SUPPLY — 51 items
BENZOIN TINCTURE PRP APPL 2/3 (GAUZE/BANDAGES/DRESSINGS) ×4 IMPLANT
BLADE AVERAGE 25MMX9MM (BLADE) ×1
BLADE AVERAGE 25X9 (BLADE) ×3 IMPLANT
BLADE OSCILLATING /SAGITTAL (BLADE) ×4 IMPLANT
BLADE SURG 15 STRL LF DISP TIS (BLADE) IMPLANT
BLADE SURG 15 STRL SS (BLADE)
BNDG ELASTIC 4X5.8 VLCR STR LF (GAUZE/BANDAGES/DRESSINGS) IMPLANT
BNDG ELASTIC 6X5.8 VLCR STR LF (GAUZE/BANDAGES/DRESSINGS) ×4 IMPLANT
BNDG GAUZE ELAST 4 BULKY (GAUZE/BANDAGES/DRESSINGS) IMPLANT
CHLORAPREP W/TINT 26 (MISCELLANEOUS) IMPLANT
CLOSURE WOUND 1/2 X4 (GAUZE/BANDAGES/DRESSINGS)
COVER SURGICAL LIGHT HANDLE (MISCELLANEOUS) ×4 IMPLANT
DRESSING PREVENA PLUS CUSTOM (GAUZE/BANDAGES/DRESSINGS) ×2 IMPLANT
DRSG MEPITEL 4X7.2 (GAUZE/BANDAGES/DRESSINGS) ×4 IMPLANT
DRSG PAD ABDOMINAL 8X10 ST (GAUZE/BANDAGES/DRESSINGS) IMPLANT
DRSG PREVENA PLUS CUSTOM (GAUZE/BANDAGES/DRESSINGS) ×4
DRSG VAC ATS MED SENSATRAC (GAUZE/BANDAGES/DRESSINGS) ×4 IMPLANT
ELECT REM PT RETURN 9FT ADLT (ELECTROSURGICAL)
ELECTRODE REM PT RTRN 9FT ADLT (ELECTROSURGICAL) IMPLANT
GAUZE PACKING IODOFORM 1/4X15 (PACKING) ×4 IMPLANT
GAUZE SPONGE 4X4 12PLY STRL (GAUZE/BANDAGES/DRESSINGS) IMPLANT
GAUZE XEROFORM 1X8 LF (GAUZE/BANDAGES/DRESSINGS) IMPLANT
GLOVE BIO SURGEON STRL SZ8 (GLOVE) ×16 IMPLANT
GLOVE BIOGEL PI IND STRL 8 (GLOVE) ×6 IMPLANT
GLOVE BIOGEL PI INDICATOR 8 (GLOVE) ×6
GOWN STRL REUS W/ TWL LRG LVL3 (GOWN DISPOSABLE) ×6 IMPLANT
GOWN STRL REUS W/TWL LRG LVL3 (GOWN DISPOSABLE) ×6
KIT BASIN OR (CUSTOM PROCEDURE TRAY) ×4 IMPLANT
KIT TURNOVER KIT B (KITS) ×4 IMPLANT
MICROMATRIX 1000MG (Tissue) ×4 IMPLANT
NEEDLE PRECISIONGLIDE 27X1.5 (NEEDLE) IMPLANT
NS IRRIG 1000ML POUR BTL (IV SOLUTION) ×4 IMPLANT
PACK ORTHO EXTREMITY (CUSTOM PROCEDURE TRAY) ×4 IMPLANT
PAD ARMBOARD 7.5X6 YLW CONV (MISCELLANEOUS) ×8 IMPLANT
PAD CAST 4YDX4 CTTN HI CHSV (CAST SUPPLIES) IMPLANT
PADDING CAST COTTON 4X4 STRL (CAST SUPPLIES)
SOL PREP POV-IOD 4OZ 10% (MISCELLANEOUS) ×4 IMPLANT
SOLUTION PARTIC MCRMTRX 1000MG (Tissue) ×2 IMPLANT
STAPLER VISISTAT 35W (STAPLE) ×4 IMPLANT
STRIP CLOSURE SKIN 1/2X4 (GAUZE/BANDAGES/DRESSINGS) IMPLANT
SUT ETHILON 3 0 PS 1 (SUTURE) ×16 IMPLANT
SUT MNCRL+ AB 3-0 CT1 36 (SUTURE) ×2 IMPLANT
SUT MONOCRYL AB 3-0 CT1 36IN (SUTURE) ×2
SUT PROLENE 4 0 PS 2 18 (SUTURE) IMPLANT
SUT VIC AB 3-0 PS2 18 (SUTURE) IMPLANT
SUT VICRYL 4-0 PS2 18IN ABS (SUTURE) ×4 IMPLANT
SYR CONTROL 10ML LL (SYRINGE) ×4 IMPLANT
TOWEL GREEN STERILE (TOWEL DISPOSABLE) ×4 IMPLANT
TUBE CONNECTING 12'X1/4 (SUCTIONS) ×1
TUBE CONNECTING 12X1/4 (SUCTIONS) ×3 IMPLANT
YANKAUER SUCT BULB TIP NO VENT (SUCTIONS) ×4 IMPLANT

## 2020-12-09 NOTE — Anesthesia Preprocedure Evaluation (Signed)
Anesthesia Evaluation  Patient identified by MRN, date of birth, ID band Patient awake    Reviewed: Patient's Chart, lab work & pertinent test results, reviewed documented beta blocker date and time   Airway Mallampati: II  TM Distance: >3 FB Neck ROM: Full    Dental  (+) Teeth Intact   Pulmonary neg pulmonary ROS, Current Smoker,    Pulmonary exam normal        Cardiovascular hypertension, Pt. on medications and Pt. on home beta blockers  Rhythm:Regular Rate:Normal     Neuro/Psych negative neurological ROS  negative psych ROS   GI/Hepatic negative GI ROS, Neg liver ROS,   Endo/Other  diabetes, Poorly Controlled, Type 2, Insulin Dependent  Renal/GU negative Renal ROS  negative genitourinary   Musculoskeletal Osteomyelitis right foot   Abdominal (+)  Abdomen: soft. Bowel sounds: normal.  Peds  Hematology negative hematology ROS (+)   Anesthesia Other Findings   Reproductive/Obstetrics                             Anesthesia Physical Anesthesia Plan  ASA: III  Anesthesia Plan: MAC   Post-op Pain Management:    Induction: Intravenous  PONV Risk Score and Plan: Ondansetron and Propofol infusion  Airway Management Planned: Simple Face Mask, Natural Airway and Nasal Cannula  Additional Equipment: None  Intra-op Plan:   Post-operative Plan:   Informed Consent: I have reviewed the patients History and Physical, chart, labs and discussed the procedure including the risks, benefits and alternatives for the proposed anesthesia with the patient or authorized representative who has indicated his/her understanding and acceptance.     Dental advisory given  Plan Discussed with: CRNA  Anesthesia Plan Comments: (Lab Results      Component                Value               Date                      WBC                      11.6 (H)            12/09/2020                HGB                       7.9 (L)             12/09/2020                HCT                      23.0 (L)            12/09/2020                MCV                      73.2 (L)            12/09/2020                PLT                      323  12/09/2020           Lab Results      Component                Value               Date                      NA                       137                 12/09/2020                K                        4.4                 12/09/2020                CO2                      23                  12/09/2020                GLUCOSE                  193 (H)             12/09/2020                BUN                      19                  12/09/2020                CREATININE               0.98                12/09/2020                CALCIUM                  8.6 (L)             12/09/2020                GFRNONAA                 >60                 12/09/2020          )        Anesthesia Quick Evaluation

## 2020-12-09 NOTE — Brief Op Note (Signed)
12/09/2020  10:49 AM  PATIENT:  Benjamin Russo  44 y.o. male  PRE-OPERATIVE DIAGNOSIS:  osteomyelitis  POST-OPERATIVE DIAGNOSIS:  osteomyelitis  PROCEDURE:  Procedure(s): Right foot wound debridment and delayed closure (Right) 3rd METATARSAL HEAD EXCISION (Right)  SURGEON:  Surgeon(s) and Role:    * Evelina Bucy, DPM - Primary    * Trula Slade, DPM - Assisting  PHYSICIAN ASSISTANT:   ASSISTANTS: none   ANESTHESIA:   local and MAC  EBL:  5 mL   BLOOD ADMINISTERED:none  DRAINS: Wound VAC   LOCAL MEDICATIONS USED:  MARCAINE    and Amount: 10 ml  SPECIMEN:   ID Type Source Tests Collected by Time Destination  1 : 3rd metatarsal bone Tissue PATH Bone resection SURGICAL PATHOLOGY Evelina Bucy, DPM 12/09/2020 1001   A : 3rd metatarsal bone cultures Tissue Bone AEROBIC/ANAEROBIC CULTURE (SURGICAL/DEEP WOUND) Evelina Bucy, DPM 12/09/2020 1004   B : RIGHT FOOT WOUND CULTURES Wound Foot, Right GRAM STAIN, AEROBIC/ANAEROBIC CULTURE (SURGICAL/DEEP WOUND) Evelina Bucy, DPM 12/09/2020 1006       DISPOSITION OF SPECIMEN:  path, micro  COUNTS:  YES  TOURNIQUET:  * No tourniquets in log *  DICTATION: .Dragon Dictation  PLAN OF CARE: transfer to floor  PATIENT DISPOSITION:  PACU - hemodynamically stable.   Delay start of Pharmacological VTE agent (>24hrs) due to surgical blood loss or risk of bleeding: not applicable

## 2020-12-09 NOTE — Progress Notes (Signed)
PROGRESS NOTE  Benjamin Russo:[id number] DOB: 1976-07-15   PCP: Charlott Rakes, MD  Patient is from: Home  DOA: 12/04/2020 LOS: 5  Chief complaints: Right foot infection and wound  Brief Narrative / Interim history: 44 year old male with history of IDDM-2, tobacco use, noncompliance, HTN, anemia and recent right fourth toe amputation for possible gangrene about 2 weeks ago admitted from podiatry office, Dr. [name] with concern for osteomyelitis and gangrene in right fifth toe and PAD.  Patient has had discoloration and discharge from fifth toe for about a week.  Also subjective fever, chills and night sweats intermittently.  Per podiatry note, had x-ray concerning for bone erosion.  He was started on vancomycin and cefepime, and admitted. ABI with noncompressible BLE arteries and significantly reduced TBI.  Vascular surgery consulted, and recommended medical management for his PAD.  MRI of his right foot concerning for osteomyelitis in his third toe, third through fifth metatarsal bones, and diffuse soft tissue inflammation/infection in the right foot.  Patient underwent I&D, fifth ray partial amputation and metatarsectomy fourth partial metatarsal on 12/07/2020.  He returned to OR and had third metatarsal head resection, debridement and delayed wound closure by Dr. [name] on 12/09/2020.  Subjective: Seen and examined earlier this morning before he went down for surgery.  No major events overnight of this morning.  No complaints.  Pain fairly controlled.  He denies chest pain, dyspnea, GI or UTI symptoms.  He denies melena or hematochezia.  Denies dizziness or lightheadedness.  Objective: Vitals:   12/09/20 1115 12/09/20 1125 12/09/20 1140 12/09/20 1205  BP: 124/71 122/79 137/65 129/78  Pulse: 69 66 72 73  Resp: Temp:   (!) 97.2 F (36.2 C) (!) 97.4 F (36.3 C)  TempSrc:    Oral  SpO2: 100% 97% 99% 99%  Weight:      Height:        Intake/Output Summary (Last 24  hours) at 12/09/2020 1225 Last data filed at 12/09/2020 1044 Gross per 24 hour  Intake 891.32 ml  Output 1905 ml  Net -1013.68 ml   Filed Weights   12/04/20 2111 12/07/20 1758  Weight: 87.3 kg 87.3 kg    Examination:  GENERAL: No apparent distress.  Nontoxic. HEENT: MMM.  Vision and hearing grossly intact.  NECK: Supple.  No apparent JVD.  RESP:  No IWOB.  Fair aeration bilaterally. CVS:  RRR. Heart sounds normal.  ABD/GI/GU: BS+. Abd soft, NTND.  MSK/EXT:  Moves extremities.  Ace wrap over right foot DCI. SKIN: Ace wrap over right foot DCI. NEURO: Awake, alert and oriented appropriately.  No apparent focal neuro deficit. PSYCH: Calm. Normal affect.   Procedures:  12/07/2020-I&D, fifth ray partial amputation and metatarsectomy fourth partial metatarsal by Dr. [name] 12/09/2020-third metatarsal head resection, debridement and delayed wound closure by Dr. [name]   Microbiology summarized: COVID-19 and influenza PCR nonreactive Blood cultures NGTD. MRSA PCR nonreactive. Surgical culture pending.  Assessment & Plan: Cellulitis with possible osteomyelitis and gangrene of right fifth toe Recent right first toe amputation for possible gangrene Bilateral PAD with noncompressible arteries. -ABI with noncompressible arteries in BLE and severely reduced TBI bilaterally -MRI  concerning for osteo in rt third toe, third through fifth metatarsal bones, and myositis/cellulitis. -Vascular surgery recommended medical management for his PAD -Surgical procedures as above.  Surgical culture pending.  Blood cultures NGTD. -Continue broad-spectrum antibiotic with vancomycin and cefepime -Continue high intensity statin.  Will start aspirin after surgery whenever it is okay  with podiatry -Discussed about importance of compliance, tobacco cessation, daily walking and diabetic control  Uncontrolled IDDM-2 with hyperglycemia: A1c 9.1%.  Seems to be on Lantus 25 units nightly and Metformin 500 mg  twice daily at home. Recent Labs  Lab 12/08/20 1643 12/08/20 2243 12/09/20 0440 12/09/20 0621 12/09/20 0818  GLUCAP 199* 188* 181* 186* 165*  -Increased Lantus to 10 units twice daily on 12/10 -Increased SSI to moderate on 12/10 -Further adjustment as appropriate -Continue atorvastatin  Essential hypertension: Normotensive. -Continue home metoprolol.  Microcytic anemia: B/l Hgb 11-12>> 8.7> 9.1> 8.5> 7.9.  Denies melena or hematochezia.  Anemia panel favors anemia of chronic disease but difficult to interpret with ongoing infection.  He is not symptomatic. -Continue monitoring -Transfuse for Hgb less than 7.0 -Recheck anemia panel  Tobacco use disorder: Reports smoking about 2 cigarettes a day. -Discussed the importance of tobacco cessation and encouraged -Nicotine patch as needed  Hyponatremia: Likely due to hyperglycemia.  Resolved. -Manage hyperglycemia as above.  Leukocytosis with bandemia: Likely due to #1.  Improved -Continue monitoring  Body mass index is 27.62 kg/m.         DVT prophylaxis:  SCDs Start: 12/05/20 0140  Code Status: Full code Family Communication: Patient and/or RN. Available if any question.  Status is: Inpatient  Remains inpatient appropriate because:IV treatments appropriate due to intensity of illness or inability to take PO and Inpatient level of care appropriate due to severity of illness   Dispo: The patient is from: Home              Anticipated d/c is to: Home              Anticipated d/c date is: 3 days              Patient currently is not medically stable to d/c.       Consultants:  Podiatry-following Vascular surgery-signed off   Sch Meds:  Scheduled Meds: . atorvastatin  40 mg Oral Daily  . fentaNYL      .  HYDROmorphone (DILAUDID) injection  2 mg Intravenous Once  . insulin aspart  0-15 Units Subcutaneous TID WC  . insulin aspart  0-5 Units Subcutaneous QHS  . insulin aspart  11 Units Subcutaneous Once  .  insulin glargine  10 Units Subcutaneous BID  . metoprolol succinate  25 mg Oral Daily   Continuous Infusions: . ceFEPime (MAXIPIME) IV 2 g (12/09/20 0155)  . vancomycin 1,000 mg (12/09/20 0538)   PRN Meds:.acetaminophen **OR** acetaminophen  Antimicrobials: Anti-infectives (From admission, onward)   Start     Dose/Rate Route Frequency Ordered Stop   12/08/20 0600  vancomycin (VANCOCIN) IVPB 1000 mg/200 mL premix        1,000 mg 200 mL/hr over 60 Minutes Intravenous Every 12 hours 12/07/20 1154     12/07/20 1857  vancomycin (VANCOCIN) powder  Status:  Discontinued          As needed 12/07/20 1900 12/07/20 1935   12/05/20 1000  vancomycin (VANCOCIN) IVPB 1000 mg/200 mL premix  Status:  Discontinued        1,000 mg 200 mL/hr over 60 Minutes Intravenous Every 8 hours 12/05/20 0525 12/07/20 1154   12/05/20 1000  ceFEPIme (MAXIPIME) 2 g in sodium chloride 0.9 % 100 mL IVPB        2 g 200 mL/hr over 30 Minutes Intravenous Every 8 hours 12/05/20 0525     12/05/20 0230  vancomycin (VANCOREADY) IVPB 1500 mg/300 mL  1,500 mg 150 mL/hr over 120 Minutes Intravenous  Once 12/05/20 0142 12/05/20 0447   12/05/20 0230  ceFEPIme (MAXIPIME) 2 g in sodium chloride 0.9 % 100 mL IVPB        2 g 200 mL/hr over 30 Minutes Intravenous  Once 12/05/20 0142 12/05/20 0314       I have personally reviewed the following labs and images: CBC: Recent Labs  Lab 12/05/20 0425 12/06/20 0327 12/08/20 0350 12/09/20 0259  WBC 10.3 12.2* 12.4* 11.6*  NEUTROABS 6.9 9.0* 11.2*  --   HGB 8.7* 9.1* 8.5* 7.9*  HCT 25.5* 27.1* 26.2* 23.0*  MCV 73.5* 73.6* 74.4* 73.2*  PLT 323 332 309 323   BMP &GFR Recent Labs  Lab 12/05/20 0425 12/07/20 1025 12/08/20 0350 12/08/20 0556 12/09/20 0259  NA 135 137 132* 132* 137  K 4.6 4.5 4.5 4.6 4.4  CL 104 105 99 100 106  CO2 22 22 21* 22 23  GLUCOSE 172* 104* 427* 378* 193*  BUN 23* 17 22* 21* 19  CREATININE 1.20 1.07 1.31* 1.31* 0.98  CALCIUM 8.9 8.9 8.6*  8.4* 8.6*  MG  --   --  1.8  --  1.9  PHOS  --   --  2.2*  --  2.9   Estimated Creatinine Clearance: 101.4 mL/min (by C-G formula based on SCr of 0.98 mg/dL). Liver & Pancreas: Recent Labs  Lab 12/05/20 0425 12/08/20 0350 12/09/20 0259  AST 19  --   --   ALT 26  --   --   ALKPHOS 79  --   --   BILITOT 0.7  --   --   PROT 7.1  --   --   ALBUMIN 2.7* 2.5* 2.4*   No results for input(s): LIPASE, AMYLASE in the last 168 hours. No results for input(s): AMMONIA in the last 168 hours. Diabetic: No results for input(s): HGBA1C in the last 72 hours. Recent Labs  Lab 12/08/20 1643 12/08/20 2243 12/09/20 0440 12/09/20 0621 12/09/20 0818  GLUCAP 199* 188* 181* 186* 165*   Cardiac Enzymes: No results for input(s): CKTOTAL, CKMB, CKMBINDEX, TROPONINI in the last 168 hours. No results for input(s): PROBNP in the last 8760 hours. Coagulation Profile: No results for input(s): INR, PROTIME in the last 168 hours. Thyroid Function Tests: No results for input(s): TSH, T4TOTAL, FREET4, T3FREE, THYROIDAB in the last 72 hours. Lipid Profile: No results for input(s): CHOL, HDL, LDLCALC, TRIG, CHOLHDL, LDLDIRECT in the last 72 hours. Anemia Panel: No results for input(s): VITAMINB12, FOLATE, FERRITIN, TIBC, IRON, RETICCTPCT in the last 72 hours. Urine analysis: No results found for: COLORURINE, APPEARANCEUR, LABSPEC, PHURINE, GLUCOSEU, HGBUR, BILIRUBINUR, KETONESUR, PROTEINUR, UROBILINOGEN, NITRITE, LEUKOCYTESUR Sepsis Labs: Invalid input(s): PROCALCITONIN, LACTICIDVEN  Microbiology: Recent Results (from the past 240 hour(s))  Culture, blood (routine x 2)     Status: None (Preliminary result)   Collection Time: 12/05/20  4:14 AM   Specimen: BLOOD  Result Value Ref Range Status   Specimen Description BLOOD RIGHT HAND  Final   Special Requests   Final    BOTTLES DRAWN AEROBIC AND ANAEROBIC Blood Culture results may not be optimal due to an excessive volume of blood received in culture  bottles   Culture   Final    NO GROWTH 4 DAYS Performed at [facility], [address], [place], [place] [address]    Report Status PENDING  Incomplete  Culture, blood (routine x 2)     Status: None (Preliminary result)  Collection Time: 12/05/20  4:25 AM   Specimen: BLOOD  Result Value Ref Range Status   Specimen Description BLOOD LEFT FOREARM  Final   Special Requests   Final    BOTTLES DRAWN AEROBIC AND ANAEROBIC Blood Culture results may not be optimal due to an excessive volume of blood received in culture bottles   Culture   Final    NO GROWTH 4 DAYS Performed at [facility], [address], [place], [place] [address]    Report Status PENDING  Incomplete  Surgical PCR screen     Status: None   Collection Time: 12/05/20  5:25 AM   Specimen: Nasal Mucosa; Nasal Swab  Result Value Ref Range Status   MRSA, PCR NEGATIVE NEGATIVE Final   Staphylococcus aureus NEGATIVE NEGATIVE Final    Comment: (NOTE) The Xpert SA Assay (FDA approved for NASAL specimens in patients 24 years of age and older), is one component of a comprehensive surveillance program. It is not intended to diagnose infection nor to guide or monitor treatment. Performed at [facility], [address] [place], [place] [address]   Resp Panel by RT-PCR (Flu A&B, Covid) Nasopharyngeal Swab     Status: None   Collection Time: 12/06/20  8:13 AM   Specimen: Nasopharyngeal Swab; Nasopharyngeal(NP) swabs in vial transport medium  Result Value Ref Range Status   SARS Coronavirus 2 by RT PCR NEGATIVE NEGATIVE Final    Comment: (NOTE) SARS-CoV-2 target nucleic acids are NOT DETECTED.  The SARS-CoV-2 RNA is generally detectable in upper respiratory specimens during the acute phase of infection. The lowest concentration of SARS-CoV-2 viral copies this assay can detect is 138 copies/mL. A negative result does not preclude SARS-Cov-2 infection and should not be used as the sole basis for treatment  or other patient management decisions. A negative result may occur with  improper specimen collection/handling, submission of specimen other than nasopharyngeal swab, presence of viral mutation(s) within the areas targeted by this assay, and inadequate number of viral copies(<138 copies/mL). A negative result must be combined with clinical observations, patient history, and epidemiological information. The expected result is Negative.  Fact Sheet for Patients:  BloggerCourse.com  Fact Sheet for Healthcare Providers:  SeriousBroker.it  This test is no t yet approved or cleared by the [place] FDA and  has been authorized for detection and/or diagnosis of SARS-CoV-2 by FDA under an Emergency Use Authorization (EUA). This EUA will remain  in effect (meaning this test can be used) for the duration of the COVID-19 declaration under Section 564(b)(1) of the Act, 21 U.S.C.section 360bbb-3(b)(1), unless the authorization is terminated  or revoked sooner.       Influenza A by PCR NEGATIVE NEGATIVE Final   Influenza B by PCR NEGATIVE NEGATIVE Final    Comment: (NOTE) The Xpert Xpress SARS-CoV-2/FLU/RSV plus assay is intended as an aid in the diagnosis of influenza from Nasopharyngeal swab specimens and should not be used as a sole basis for treatment. Nasal washings and aspirates are unacceptable for Xpert Xpress SARS-CoV-2/FLU/RSV testing.  Fact Sheet for Patients: BloggerCourse.com  Fact Sheet for Healthcare Providers: SeriousBroker.it  This test is not yet approved or cleared by the [place] FDA and has been authorized for detection and/or diagnosis of SARS-CoV-2 by FDA under an Emergency Use Authorization (EUA). This EUA will remain in effect (meaning this test can be used) for the duration of the COVID-19 declaration under Section 564(b)(1) of the Act, 21 U.S.C. section  360bbb-3(b)(1), unless  the authorization is terminated or revoked.  Performed at [facility], [address] [place], [place] [id number]     Radiology Studies: No results found.   Riggin Cuttino T. Sheikh Leverich [facility]  If 7PM-7AM, please contact night-coverage [email or web address] 12/09/2020, 12:25 PM

## 2020-12-09 NOTE — Anesthesia Procedure Notes (Signed)
Procedure Name: MAC Date/Time: 12/09/2020 9:44 AM Performed by: Dorthea Cove, CRNA Pre-anesthesia Checklist: Patient identified, Emergency Drugs available, Suction available, Patient being monitored and Timeout performed Patient Re-evaluated:Patient Re-evaluated prior to induction Oxygen Delivery Method: Simple face mask Preoxygenation: Pre-oxygenation with 100% oxygen Induction Type: IV induction Dental Injury: Teeth and Oropharynx as per pre-operative assessment

## 2020-12-09 NOTE — Plan of Care (Signed)
  Problem: Pain Managment: Goal: General experience of comfort will improve Outcome: Progressing   Problem: Safety: Goal: Ability to remain free from injury will improve Outcome: Progressing   Problem: Skin Integrity: Goal: Risk for impaired skin integrity will decrease Outcome: Progressing   Problem: Activity: Goal: Ability to perform//tolerate increased activity and mobilize with assistive devices will improve Outcome: Progressing   Problem: Pain Management: Goal: Pain level will decrease with appropriate interventions Outcome: Progressing   Problem: Skin Integrity: Goal: Demonstration of wound healing without infection will improve Outcome: Progressing

## 2020-12-09 NOTE — Transfer of Care (Signed)
Immediate Anesthesia Transfer of Care Note  Patient: Benjamin Russo  Procedure(s) Performed: Right foot wound debridment and delayed closure (Right ) 3rd METATARSAL HEAD EXCISION (Right Toe)  Patient Location: PACU  Anesthesia Type:MAC  Level of Consciousness: awake, alert  and oriented  Airway & Oxygen Therapy: Patient Spontanous Breathing  Post-op Assessment: Report given to RN and Post -op Vital signs reviewed and stable  Post vital signs: Reviewed and stable  Last Vitals:  Vitals Value Taken Time  BP 114/96 12/09/20 1055  Temp    Pulse 71 12/09/20 1057  Resp 14 12/09/20 1057  SpO2 99 % 12/09/20 1057  Vitals shown include unvalidated device data.  Last Pain:  Vitals:   12/09/20 0816  TempSrc: Oral  PainSc:       Patients Stated Pain Goal: 2 (41/44/36 0165)  Complications: No complications documented.

## 2020-12-09 NOTE — Anesthesia Postprocedure Evaluation (Signed)
Anesthesia Post Note  Patient: Izel Eisenhardt  Procedure(s) Performed: Right foot wound debridment and delayed closure (Right ) 3rd METATARSAL HEAD EXCISION (Right Toe)     Patient location during evaluation: PACU Anesthesia Type: MAC Level of consciousness: awake and alert Pain management: pain level controlled Vital Signs Assessment: post-procedure vital signs reviewed and stable Respiratory status: spontaneous breathing, nonlabored ventilation, respiratory function stable and patient connected to nasal cannula oxygen Cardiovascular status: stable and blood pressure returned to baseline Postop Assessment: no apparent nausea or vomiting Anesthetic complications: no   No complications documented.  Last Vitals:  Vitals:   12/09/20 1402 12/09/20 1500  BP: 128/76 128/73  Pulse: 80 82  Resp: 18 17  Temp: 36.6 C 36.7 C  SpO2: 100% 100%    Last Pain:  Vitals:   12/09/20 1500  TempSrc: Oral  PainSc:                  March Rummage Carlisha Wisler

## 2020-12-09 NOTE — Op Note (Signed)
  Patient Name: Benjamin Russo DOB: Jul 10, 1976  MRN: [id number]   Date of Surgery: 12/09/20  Surgeon: Dr. Hardie Pulley, DPM Assistants: Dr. Annice Needy, DPM  Pre-operative Diagnosis:  Ulcer right foot, Osteomyelitis, encounter for Delayed Closure of wound Post-operative Diagnosis:  same Procedures:  1) 3rd Metatarsal resection  2) 3rd Proximal Phalanx partial phalangectomy  3) Delayed closure of wound, complex  4) Application of Wound VAC Pathology/Specimens: ID Type Source Tests Collected by Time Destination  1 : 3rd metatarsal bone Tissue PATH Bone resection SURGICAL PATHOLOGY Evelina Bucy, DPM 12/09/2020 1001   A : 3rd metatarsal bone cultures Tissue Bone AEROBIC/ANAEROBIC CULTURE (SURGICAL/DEEP WOUND) Evelina Bucy, DPM 12/09/2020 1004   B : RIGHT FOOT WOUND CULTURES Wound Foot, Right GRAM STAIN, AEROBIC/ANAEROBIC CULTURE (SURGICAL/DEEP WOUND) Evelina Bucy, DPM 12/09/2020 1006    Anesthesia: MAC/local Hemostasis: * No tourniquets in log * Estimated Blood Loss: 5 mL Materials: * No implants in log * Medications:  MicroMatrix powder 1g Complications: none  Indications for Procedure:  This is a 44 y.o. male with a previous amputation of his right 4th ray. He presents for planned delayed wound closure.   Procedure in Detail: Patient was identified in pre-operative holding area. Formal consent was signed and the right lower extremity was marked. Patient was brought back to the operating room. Anesthesia was induced. The extremity was prepped and draped in the usual sterile fashion. Timeout was taken to confirm patient name, laterality, and procedure prior to incision.  Attention was then directed to the right foot. The previous sutures were cut and the wound flaps were opened. The wound was then copiously irrigated with 3L of normal saline via pulse lavage.   The wound was then thoroughly sharply excisional debrided with a rongeur and 15 blade.  Debridement was  continued until the wound base was fully viable.  There was necrosis of the both the metatarsal and phalanx at the metatarsophalangeal joint -and portions were deemed nonviable. Thus a sagittal saw was used to excise a portion of the third metatarsal and a small portion of the accompanying proximal phalanx base.  The wound was then thoroughly irrigated for another 3 L normal saline.  Hemostasis was achieved.  Micro matrix powder that was reconstituted in saline to form a paste was applied topically to the wound.  Skin edges were undermined and wound edges were reapproximated with 3-0 Monocryl 3-0 nylon and skin staples.  Dogears were excised.   The foot was then dressed with mepilex and a wound VAC was applied. Black foam was applied deep in the wound at the 3rd metatarsal area. The remainder of the sponge was applied over the incision and VAC draping was applied. It was set to suction at 125 mmHg.  Patient tolerated the procedure well.  Dr. Jacqualyn Posey was scrubbed and present for the entirety of the procedure, and assisted in positioning, achieving hemostasis, and assisted in suturing.  Disposition: Following a period of post-operative monitoring, patient will be transferred to the floor.

## 2020-12-09 NOTE — Progress Notes (Addendum)
12/10 1230 Pre-surgery ensure or Gatorade not available.   12/11 0630 Pt CBG is 186 mg/dL.

## 2020-12-09 NOTE — Progress Notes (Signed)
  Subjective:  Patient ID: Benjamin Russo, male    DOB: 11-15-1976,  MRN: [id number]  Patient seen in pre-op. Understands plan for the OR today. Objective:   Vitals:   12/09/20 0343 12/09/20 0816  BP: (!) 149/78 112/66  Pulse: 74 85  Resp: 16 18  Temp: 98 F (36.7 C) (!) 97.3 F (36.3 C)  SpO2: 98% 100%   Wound RLE healing well, only small areas of continued necrosis. Wound margins viable and appear well perfused. Sanguinous drainage noted but no active bleeding. No warmth, erythema noted.  Assessment & Plan:  Patient was evaluated and treated and all questions answered.  Gangrene right foot, osteomyelitis -Continue IV Abx -Continue pain management -Heel WB RLE -Plan for planned RTOR todayfor delayed wound closure, 3rd metatarsal resection. Will likely be ok for d/c Monday. -Will continue to follow.  Park Liter, DPM  Accessible via secure chat for questions or concerns.

## 2020-12-09 NOTE — Plan of Care (Signed)
  Problem: Education: Goal: Knowledge of General Education information will improve Description: Including pain rating scale, medication(s)/side effects and non-pharmacologic comfort measures Outcome: Progressing   Problem: Health Behavior/Discharge Planning: Goal: Ability to manage health-related needs will improve Outcome: Progressing   Problem: Activity: Goal: Risk for activity intolerance will decrease Outcome: Progressing   Problem: Nutrition: Goal: Adequate nutrition will be maintained Outcome: Progressing   Problem: Coping: Goal: Level of anxiety will decrease Outcome: Progressing   Problem: Elimination: Goal: Will not experience complications related to bowel motility Outcome: Progressing Goal: Will not experience complications related to urinary retention Outcome: Progressing   Problem: Pain Managment: Goal: General experience of comfort will improve Outcome: Progressing   

## 2020-12-10 LAB — CBC WITH DIFFERENTIAL/PLATELET
Abs Immature Granulocytes: 0.08 10*3/uL — ABNORMAL HIGH (ref 0.00–0.07)
Basophils Absolute: 0.1 10*3/uL (ref 0.0–0.1)
Basophils Relative: 1 %
Eosinophils Absolute: 0.2 10*3/uL (ref 0.0–0.5)
Eosinophils Relative: 2 %
HCT: 24.9 % — ABNORMAL LOW (ref 39.0–52.0)
Hemoglobin: 7.9 g/dL — ABNORMAL LOW (ref 13.0–17.0)
Immature Granulocytes: 1 %
Lymphocytes Relative: 17 %
Lymphs Abs: 1.8 10*3/uL (ref 0.7–4.0)
MCH: 23.7 pg — ABNORMAL LOW (ref 26.0–34.0)
MCHC: 31.7 g/dL (ref 30.0–36.0)
MCV: 74.6 fL — ABNORMAL LOW (ref 80.0–100.0)
Monocytes Absolute: 0.9 10*3/uL (ref 0.1–1.0)
Monocytes Relative: 8 %
Neutro Abs: 7.4 10*3/uL (ref 1.7–7.7)
Neutrophils Relative %: 71 %
Platelets: 299 10*3/uL (ref 150–400)
RBC: 3.34 MIL/uL — ABNORMAL LOW (ref 4.22–5.81)
RDW: 13.4 % (ref 11.5–15.5)
WBC: 10.5 10*3/uL (ref 4.0–10.5)
nRBC: 0 % (ref 0.0–0.2)

## 2020-12-10 LAB — GLUCOSE, CAPILLARY
Glucose-Capillary: 100 mg/dL — ABNORMAL HIGH (ref 70–99)
Glucose-Capillary: 140 mg/dL — ABNORMAL HIGH (ref 70–99)
Glucose-Capillary: 148 mg/dL — ABNORMAL HIGH (ref 70–99)
Glucose-Capillary: 155 mg/dL — ABNORMAL HIGH (ref 70–99)
Glucose-Capillary: 165 mg/dL — ABNORMAL HIGH (ref 70–99)
Glucose-Capillary: 204 mg/dL — ABNORMAL HIGH (ref 70–99)

## 2020-12-10 LAB — CULTURE, BLOOD (ROUTINE X 2)
Culture: NO GROWTH
Culture: NO GROWTH

## 2020-12-10 LAB — RENAL FUNCTION PANEL
Albumin: 2.4 g/dL — ABNORMAL LOW (ref 3.5–5.0)
Anion gap: 9 (ref 5–15)
BUN: 18 mg/dL (ref 6–20)
CO2: 23 mmol/L (ref 22–32)
Calcium: 8.8 mg/dL — ABNORMAL LOW (ref 8.9–10.3)
Chloride: 103 mmol/L (ref 98–111)
Creatinine, Ser: 1.03 mg/dL (ref 0.61–1.24)
GFR, Estimated: >60 mL/min (ref >60)
Glucose, Bld: 159 mg/dL — ABNORMAL HIGH (ref 70–99)
Phosphorus: 3.4 mg/dL (ref 2.5–4.6)
Potassium: 4.3 mmol/L (ref 3.5–5.1)
Sodium: 135 mmol/L (ref 135–145)

## 2020-12-10 LAB — VANCOMYCIN, TROUGH: Vancomycin Tr: 14 ug/mL — ABNORMAL LOW (ref 15–20)

## 2020-12-10 LAB — MAGNESIUM: Magnesium: 1.7 mg/dL (ref 1.7–2.4)

## 2020-12-10 MED ORDER — VANCOMYCIN HCL 1250 MG/250ML IV SOLN
1250.0000 mg | Freq: Two times a day (BID) | INTRAVENOUS | Status: DC
  Administered 2020-12-10 – 2020-12-11 (×3): 1250 mg via INTRAVENOUS
  Filled 2020-12-10 (×4): qty 250

## 2020-12-10 NOTE — Progress Notes (Signed)
Pharmacy Antibiotic Note  Benjamin Russo is a 44 y.o. male admitted on 12/04/2020 with RLE gangrene/osteomyelitis. Planned partial 4th-5th toe amputation 12/10. Pharmacy has been consulted for Vancomycin dosing.  12/12 Vancomycin trough 14 mcg/ml (slightly subtherapeutic) on 1gm IV q12h  Plan: Increase vancomycin to 1250mg  Q12 hr  (expected trough ~17 mcg/ml) Cefepime 2 g IV q8h  Monitor cultures, clinical status, renal fx Will f/u vancomycin trough at Css on new dose  Height: 5\' 10"  (177.8 cm) Weight: 87.3 kg (192 lb 7.4 oz) IBW/kg (Calculated) : 73  Temp (24hrs), Avg:97.9 F (36.6 C), Min:97.2 F (36.2 C), Max:99.6 F (37.6 C)  Recent Labs  Lab 12/05/20 0425 12/06/20 0327 12/07/20 1025 12/08/20 0350 12/08/20 0556 12/09/20 0259 12/10/20 0450  WBC 10.3 12.2*  --  12.4*  --  11.6* 10.5  CREATININE 1.20  --  1.07 1.31* 1.31* 0.98 1.03  VANCOTROUGH  --   --  28*  --   --   --  14*    Estimated Creatinine Clearance: 96.5 mL/min (by C-G formula based on SCr of 1.03 mg/dL).    No Known Allergies  Antimicrobials this admit: Cefepime 12/7 >>  Vanc 12/7 >>    Dose changes this admisstion  12/9 VR drawn 7.5hr from dose =28, decr to 1g Q 12hr 12/12 VT 14, increase to 1250mg  IV q12h  Microbiology 12/7 Bcx: NGTD 12/7 MRSA neg 12/11 Wound Cx (foot): pending 12/11 Wound Cx (bone tissue): pending   14/7, PharmD, BCPS Please see amion for complete clinical pharmacist phone list 12/10/2020 5:47 AM

## 2020-12-10 NOTE — Plan of Care (Signed)
  Problem: Education: Goal: Knowledge of General Education information will improve Description: Including pain rating scale, medication(s)/side effects and non-pharmacologic comfort measures Outcome: Progressing   Problem: Health Behavior/Discharge Planning: Goal: Ability to manage health-related needs will improve Outcome: Progressing   Problem: Activity: Goal: Risk for activity intolerance will decrease Outcome: Progressing   Problem: Nutrition: Goal: Adequate nutrition will be maintained Outcome: Progressing   Problem: Elimination: Goal: Will not experience complications related to bowel motility Outcome: Progressing Goal: Will not experience complications related to urinary retention Outcome: Progressing   Problem: Pain Managment: Goal: General experience of comfort will improve Outcome: Progressing   

## 2020-12-10 NOTE — Progress Notes (Signed)
Subjective: POD #1 s/p right third metatarsal head resection, delayed primary closure wound and application of wound VAC with Dr. [name].  States he is doing well this morning.  No significant pain.  He is asking about how to take care of his foot upon discharge today.  Currently denies any fevers or chills.  No nausea or vomiting.  No other concerns.  Objective: AAO x3, NAD Wound VAC in place with approximately 15 cc of serosanguineous drainage in the canister.  There is no strikethrough.  There is no pain with calf compression, erythema or warmth. No pain with calf compression, swelling, warmth, erythema  Assessment: POD #1 s/p right foot third metatarsal head excision, delayed primary closure of ulcer  Plan: White blood cell count has normalized today.  Currently afebrile.  Had a T-max of 99.6 overnight.  At this time continue IV antibiotics and await cultures from surgery yesterday.  We will likely plan on changing the dressing possibly tomorrow.  Discharge pending cultures.  Ovid Curd, DPM

## 2020-12-10 NOTE — Progress Notes (Signed)
PROGRESS NOTE  Benjamin Russo EHU:[id number] DOB: 1976/01/01   PCP: Maryella Shivers, MD  Patient is from: Home  DOA: 12/04/2020 LOS: 6  Chief complaints: Right foot infection and wound  Brief Narrative / Interim history: 44 year old male with history of IDDM-2, tobacco use, noncompliance, HTN, anemia and recent right fourth toe amputation for possible gangrene about 2 weeks ago admitted from podiatry office, Dr. March Rummage with concern for osteomyelitis and gangrene in right fifth toe and PAD.  Patient has had discoloration and discharge from fifth toe for about a week.  Also subjective fever, chills and night sweats intermittently.  Per podiatry note, had x-ray concerning for bone erosion.  He was started on vancomycin and cefepime, and admitted. ABI with noncompressible BLE arteries and significantly reduced TBI.  Vascular surgery consulted, and recommended medical management for his PAD.  MRI of his right foot concerning for osteomyelitis in his third toe, third through fifth metatarsal bones, and diffuse soft tissue inflammation/infection in the right foot.  Patient underwent I&D, fifth ray partial amputation and metatarsectomy fourth partial metatarsal on 12/07/2020.  He returned to OR and had third metatarsal head resection, debridement and delayed wound closure by Dr. March Rummage on 12/09/2020.  Subjective: Seen and examined earlier this morning.  No major events overnight of this morning.  Denies pain other than occasional tingling sensation.  No other complaints.  Objective: Vitals:   12/09/20 1934 12/09/20 2305 12/10/20 0351 12/10/20 0809  BP: 134/68 (!) 146/71 139/66 125/71  Pulse: 80 74 80 76  Resp: $Remo'14 16 15 16  'QWFwi$ Temp: 98.5 F (36.9 C) 99.6 F (37.6 C) 98.1 F (36.7 C) 97.9 F (36.6 C)  TempSrc: Oral Oral Oral Oral  SpO2: 98% 97% 97% 98%  Weight:      Height:        Intake/Output Summary (Last 24 hours) at 12/10/2020 1109 Last data filed at 12/10/2020 0900 Gross per 24 hour   Intake 480 ml  Output 1600 ml  Net -1120 ml   Filed Weights   12/04/20 2111 12/07/20 1758  Weight: 87.3 kg 87.3 kg    Examination:  GENERAL: No apparent distress.  Nontoxic. HEENT: MMM.  Vision and hearing grossly intact.  NECK: Supple.  No apparent JVD.  RESP:  No IWOB.  Fair aeration bilaterally. CVS:  RRR. Heart sounds normal.  ABD/GI/GU: BS+. Abd soft, NTND.  MSK/EXT:  Moves extremities.  Ace wrap and wound VAC over right foot with very small serosang drainage. SKIN: no apparent skin lesion or wound NEURO: Awake, alert and oriented appropriately.  No apparent focal neuro deficit. PSYCH: Calm. Normal affect.  Procedures:  12/07/2020-I&D, fifth ray partial amputation and metatarsectomy fourth partial metatarsal by Dr. March Rummage 12/09/2020-third metatarsal head resection, debridement and delayed wound closure by Dr. March Rummage   Microbiology summarized: COVID-19 and influenza PCR nonreactive Blood cultures NGTD. MRSA PCR nonreactive. Surgical culture pending.  Assessment & Plan: Cellulitis with possible osteomyelitis and gangrene of right fifth toe Recent right first toe amputation for possible gangrene Bilateral PAD with noncompressible arteries. -ABI with noncompressible arteries in BLE and severely reduced TBI bilaterally -MRI  concerning for osteo in rt third toe, third through fifth metatarsal bones, and myositis/cellulitis. -Vascular surgery recommended medical management for his PAD -Surgical procedures as above.  Surgical culture pending.  Blood cultures NGTD. -CRP 10.  ESR 108. -On vancomycin and cefepime.  Monitor CRP and ESR intermittently -Continue high intensity statin.  Start p.o. aspirin -Discussed about importance of compliance, tobacco cessation, daily walking  and diabetic control  Uncontrolled IDDM-2 with hyperglycemia: A1c 9.1%.  Seems to be on Lantus 25 units nightly and Metformin 500 mg twice daily at home. Recent Labs  Lab 12/09/20 1229 12/09/20 1649  12/09/20 2043 12/10/20 0351 12/10/20 0811  GLUCAP 105* 158* 165* 155* 148*  -Increased Lantus to 10 units twice daily on 12/10 -Increased SSI to moderate on 12/10 -Further adjustment as appropriate -Continue atorvastatin  Essential hypertension: Normotensive. -Continue home metoprolol.  Microcytic anemia: B/l Hgb 11-12>> 8.7> 9.1> 8.5> 7.9.  Denies melena or hematochezia.  Anemia panel favors anemia of chronic disease but difficult to interpret with ongoing infection.  He is not symptomatic. -Continue monitoring -Transfuse for Hgb less than 7.0 -Recheck anemia panel in a day or 2  Tobacco use disorder: Reports smoking about 2 cigarettes a day. -Discussed the importance of tobacco cessation and encouraged -Nicotine patch as needed  Hyponatremia: Likely due to hyperglycemia.  Resolved. -Manage hyperglycemia as above.  Leukocytosis with bandemia: Likely due to #1.  Resolved.  Body mass index is 27.62 kg/m.         DVT prophylaxis:  SCDs Start: 12/05/20 0140  Code Status: Full code Family Communication: Patient and/or RN. Available if any question.  Status is: Inpatient  Remains inpatient appropriate because:IV treatments appropriate due to intensity of illness or inability to take PO and Inpatient level of care appropriate due to severity of illness   Dispo: The patient is from: Home              Anticipated d/c is to: Home              Anticipated d/c date is: 2 days              Patient currently is not medically stable to d/c.       Consultants:  Podiatry-following Vascular surgery-signed off   Sch Meds:  Scheduled Meds: . atorvastatin  40 mg Oral Daily  .  HYDROmorphone (DILAUDID) injection  2 mg Intravenous Once  . insulin aspart  0-15 Units Subcutaneous TID WC  . insulin aspart  0-5 Units Subcutaneous QHS  . insulin aspart  11 Units Subcutaneous Once  . insulin glargine  10 Units Subcutaneous BID  . metoprolol succinate  25 mg Oral Daily    Continuous Infusions: . ceFEPime (MAXIPIME) IV 2 g (12/10/20 0904)  . vancomycin 1,250 mg (12/10/20 0640)   PRN Meds:.acetaminophen **OR** acetaminophen  Antimicrobials: Anti-infectives (From admission, onward)   Start     Dose/Rate Route Frequency Ordered Stop   12/10/20 0645  vancomycin (VANCOREADY) IVPB 1250 mg/250 mL        1,250 mg 166.7 mL/hr over 90 Minutes Intravenous Every 12 hours 12/10/20 0546     12/08/20 0600  vancomycin (VANCOCIN) IVPB 1000 mg/200 mL premix  Status:  Discontinued        1,000 mg 200 mL/hr over 60 Minutes Intravenous Every 12 hours 12/07/20 1154 12/10/20 0546   12/07/20 1857  vancomycin (VANCOCIN) powder  Status:  Discontinued          As needed 12/07/20 1900 12/07/20 1935   12/05/20 1000  vancomycin (VANCOCIN) IVPB 1000 mg/200 mL premix  Status:  Discontinued        1,000 mg 200 mL/hr over 60 Minutes Intravenous Every 8 hours 12/05/20 0525 12/07/20 1154   12/05/20 1000  ceFEPIme (MAXIPIME) 2 g in sodium chloride 0.9 % 100 mL IVPB        2 g 200 mL/hr over  30 Minutes Intravenous Every 8 hours 12/05/20 0525     12/05/20 0230  vancomycin (VANCOREADY) IVPB 1500 mg/300 mL        1,500 mg 150 mL/hr over 120 Minutes Intravenous  Once 12/05/20 0142 12/05/20 0447   12/05/20 0230  ceFEPIme (MAXIPIME) 2 g in sodium chloride 0.9 % 100 mL IVPB        2 g 200 mL/hr over 30 Minutes Intravenous  Once 12/05/20 0142 12/05/20 0314       I have personally reviewed the following labs and images: CBC: Recent Labs  Lab 12/05/20 0425 12/06/20 0327 12/08/20 0350 12/09/20 0259 12/10/20 0450  WBC 10.3 12.2* 12.4* 11.6* 10.5  NEUTROABS 6.9 9.0* 11.2*  --  7.4  HGB 8.7* 9.1* 8.5* 7.9* 7.9*  HCT 25.5* 27.1* 26.2* 23.0* 24.9*  MCV 73.5* 73.6* 74.4* 73.2* 74.6*  PLT 323 332 309 323 299   BMP &GFR Recent Labs  Lab 12/07/20 1025 12/08/20 0350 12/08/20 0556 12/09/20 0259 12/10/20 0450  NA 137 132* 132* 137 135  K 4.5 4.5 4.6 4.4 4.3  CL 105 99 100 106 103   CO2 22 21* $Remo'22 23 23  'ZJNTX$ GLUCOSE 104* 427* 378* 193* 159*  BUN 17 22* 21* 19 18  CREATININE 1.07 1.31* 1.31* 0.98 1.03  CALCIUM 8.9 8.6* 8.4* 8.6* 8.8*  MG  --  1.8  --  1.9 1.7  PHOS  --  2.2*  --  2.9 3.4   Estimated Creatinine Clearance: 96.5 mL/min (by C-G formula based on SCr of 1.03 mg/dL). Liver & Pancreas: Recent Labs  Lab 12/05/20 0425 12/08/20 0350 12/09/20 0259 12/10/20 0450  AST 19  --   --   --   ALT 26  --   --   --   ALKPHOS 79  --   --   --   BILITOT 0.7  --   --   --   PROT 7.1  --   --   --   ALBUMIN 2.7* 2.5* 2.4* 2.4*   No results for input(s): LIPASE, AMYLASE in the last 168 hours. No results for input(s): AMMONIA in the last 168 hours. Diabetic: No results for input(s): HGBA1C in the last 72 hours. Recent Labs  Lab 12/09/20 1229 12/09/20 1649 12/09/20 2043 12/10/20 0351 12/10/20 0811  GLUCAP 105* 158* 165* 155* 148*   Cardiac Enzymes: No results for input(s): CKTOTAL, CKMB, CKMBINDEX, TROPONINI in the last 168 hours. No results for input(s): PROBNP in the last 8760 hours. Coagulation Profile: No results for input(s): INR, PROTIME in the last 168 hours. Thyroid Function Tests: No results for input(s): TSH, T4TOTAL, FREET4, T3FREE, THYROIDAB in the last 72 hours. Lipid Profile: No results for input(s): CHOL, HDL, LDLCALC, TRIG, CHOLHDL, LDLDIRECT in the last 72 hours. Anemia Panel: No results for input(s): VITAMINB12, FOLATE, FERRITIN, TIBC, IRON, RETICCTPCT in the last 72 hours. Urine analysis: No results found for: COLORURINE, APPEARANCEUR, LABSPEC, PHURINE, GLUCOSEU, HGBUR, BILIRUBINUR, KETONESUR, PROTEINUR, UROBILINOGEN, NITRITE, LEUKOCYTESUR Sepsis Labs: Invalid input(s): PROCALCITONIN, [place]  Microbiology: Recent Results (from the past 240 hour(s))  Culture, blood (routine x 2)     Status: None (Preliminary result)   Collection Time: 12/05/20  4:14 AM   Specimen: BLOOD  Result Value Ref Range Status   Specimen Description BLOOD  RIGHT HAND  Final   Special Requests   Final    BOTTLES DRAWN AEROBIC AND ANAEROBIC Blood Culture results may not be optimal due to an excessive volume of blood received in  culture bottles   Culture   Final    NO GROWTH 4 DAYS Performed at [facility], [place] [address] [place], [place] [address]    Report Status PENDING  Incomplete  Culture, blood (routine x 2)     Status: None (Preliminary result)   Collection Time: 12/05/20  4:25 AM   Specimen: BLOOD  Result Value Ref Range Status   Specimen Description BLOOD LEFT FOREARM  Final   Special Requests   Final    BOTTLES DRAWN AEROBIC AND ANAEROBIC Blood Culture results may not be optimal due to an excessive volume of blood received in culture bottles   Culture   Final    NO GROWTH 4 DAYS Performed at [facility], [place] [address], [place], [place] [address]    Report Status PENDING  Incomplete  Surgical PCR screen     Status: None   Collection Time: 12/05/20  5:25 AM   Specimen: Nasal Mucosa; Nasal Swab  Result Value Ref Range Status   MRSA, PCR NEGATIVE NEGATIVE Final   Staphylococcus aureus NEGATIVE NEGATIVE Final    Comment: (NOTE) The Xpert SA Assay (FDA approved for NASAL specimens in patients 45 years of age and older), is one component of a comprehensive surveillance program. It is not intended to diagnose infection nor to guide or monitor treatment. Performed at [facility], [place] [address] [place], [place] [address]   Resp Panel by RT-PCR (Flu A&B, Covid) Nasopharyngeal Swab     Status: None   Collection Time: 12/06/20  8:13 AM   Specimen: Nasopharyngeal Swab; Nasopharyngeal(NP) swabs in vial transport medium  Result Value Ref Range Status   SARS Coronavirus 2 by RT PCR NEGATIVE NEGATIVE Final    Comment: (NOTE) SARS-CoV-2 target nucleic acids are NOT DETECTED.  The SARS-CoV-2 RNA is generally detectable in upper respiratory specimens during the acute phase of infection. The lowest concentration of  SARS-CoV-2 viral copies this assay can detect is 138 copies/mL. A negative result does not preclude SARS-Cov-2 infection and should not be used as the sole basis for treatment or other patient management decisions. A negative result may occur with  improper specimen collection/handling, submission of specimen other than nasopharyngeal swab, presence of viral mutation(s) within the areas targeted by this assay, and inadequate number of viral copies(<138 copies/mL). A negative result must be combined with clinical observations, patient history, and epidemiological information. The expected result is Negative.  Fact Sheet for Patients:  EntrepreneurPulse.com.au  Fact Sheet for Healthcare Providers:  IncredibleEmployment.be  This test is no t yet approved or cleared by the [place] FDA and  has been authorized for detection and/or diagnosis of SARS-CoV-2 by FDA under an Emergency Use Authorization (EUA). This EUA will remain  in effect (meaning this test can be used) for the duration of the COVID-19 declaration under Section 564(b)(1) of the Act, 21 U.S.C.section 360bbb-3(b)(1), unless the authorization is terminated  or revoked sooner.       Influenza A by PCR NEGATIVE NEGATIVE Final   Influenza B by PCR NEGATIVE NEGATIVE Final    Comment: (NOTE) The Xpert Xpress SARS-CoV-2/FLU/RSV plus assay is intended as an aid in the diagnosis of influenza from Nasopharyngeal swab specimens and should not be used as a sole basis for treatment. Nasal washings and aspirates are unacceptable for Xpert Xpress SARS-CoV-2/FLU/RSV testing.  Fact Sheet for Patients: EntrepreneurPulse.com.au  Fact Sheet for Healthcare Providers: IncredibleEmployment.be  This test is not yet approved or cleared by the  [place] Architectural technologist and has been authorized for detection and/or diagnosis of SARS-CoV-2 by FDA under an Patent examiner (EUA). This EUA will remain in effect (meaning this test can be used) for the duration of the COVID-19 declaration under Section 564(b)(1) of the Act, 21 U.S.C. section 360bbb-3(b)(1), unless the authorization is terminated or revoked.  Performed at [facility], [place] [address], [place], [place] [address]   Aerobic/Anaerobic Culture (surgical/deep wound)     Status: None (Preliminary result)   Collection Time: 12/09/20 10:04 AM   Specimen: Bone; Tissue  Result Value Ref Range Status   Specimen Description BONE  Final   Special Requests NONE  Final   Gram Stain NO WBC SEEN NO ORGANISMS SEEN   Final   Culture   Final    CULTURE REINCUBATED FOR BETTER GROWTH Performed at [facility], [address], [place], [place] [address]    Report Status PENDING  Incomplete  Aerobic/Anaerobic Culture (surgical/deep wound)     Status: None (Preliminary result)   Collection Time: 12/09/20 10:06 AM   Specimen: Foot, Right; Wound  Result Value Ref Range Status   Specimen Description FOOT  Final   Special Requests RIGHT  Final   Gram Stain NO WBC SEEN NO ORGANISMS SEEN   Final   Culture   Final    CULTURE REINCUBATED FOR BETTER GROWTH Performed at [facility], [address], [place], [place] [address]    Report Status PENDING  Incomplete    Radiology Studies: DG Foot 2 Views Right  Result Date: 12/09/2020 CLINICAL DATA:  Ulcer, osteomyelitis, toe amputations EXAM: RIGHT FOOT - 2 VIEW COMPARISON:  12/07/2020 FINDINGS: Additional resection of the right third metatarsal head. Previous amputations of the fourth and fifth toes back to the proximal metatarsal level. Overlying skin staples noted and dressing/wound VAC. Overall stable alignment. Peripheral vascular calcification on lateral view. Soft tissue swelling evident. IMPRESSION: Postop changes as above. No unexpected finding by plain radiography. Electronically Signed   By: Jerilynn Mages.  Shick M.D.   On: 12/09/2020  13:50     Jaidyn Kuhl T. [place]  If 7PM-7AM, please contact night-coverage [email or web address] 12/10/2020, 11:09 AM

## 2020-12-11 ENCOUNTER — Encounter (HOSPITAL_COMMUNITY): Payer: Self-pay | Admitting: Podiatry

## 2020-12-11 DIAGNOSIS — B9689 Other specified bacterial agents as the cause of diseases classified elsewhere: Secondary | ICD-10-CM

## 2020-12-11 DIAGNOSIS — E1165 Type 2 diabetes mellitus with hyperglycemia: Secondary | ICD-10-CM

## 2020-12-11 DIAGNOSIS — Z794 Long term (current) use of insulin: Secondary | ICD-10-CM

## 2020-12-11 DIAGNOSIS — L03115 Cellulitis of right lower limb: Secondary | ICD-10-CM

## 2020-12-11 DIAGNOSIS — I96 Gangrene, not elsewhere classified: Secondary | ICD-10-CM

## 2020-12-11 DIAGNOSIS — I10 Essential (primary) hypertension: Secondary | ICD-10-CM

## 2020-12-11 DIAGNOSIS — B9789 Other viral agents as the cause of diseases classified elsewhere: Secondary | ICD-10-CM

## 2020-12-11 DIAGNOSIS — E1169 Type 2 diabetes mellitus with other specified complication: Secondary | ICD-10-CM

## 2020-12-11 LAB — CBC
HCT: 25.9 % — ABNORMAL LOW (ref 39.0–52.0)
Hemoglobin: 8.3 g/dL — ABNORMAL LOW (ref 13.0–17.0)
MCH: 24 pg — ABNORMAL LOW (ref 26.0–34.0)
MCHC: 32 g/dL (ref 30.0–36.0)
MCV: 74.9 fL — ABNORMAL LOW (ref 80.0–100.0)
Platelets: 300 10*3/uL (ref 150–400)
RBC: 3.46 MIL/uL — ABNORMAL LOW (ref 4.22–5.81)
RDW: 13.6 % (ref 11.5–15.5)
WBC: 9.2 10*3/uL (ref 4.0–10.5)
nRBC: 0 % (ref 0.0–0.2)

## 2020-12-11 LAB — GLUCOSE, CAPILLARY
Glucose-Capillary: 187 mg/dL — ABNORMAL HIGH (ref 70–99)
Glucose-Capillary: 173 mg/dL — ABNORMAL HIGH (ref 70–99)
Glucose-Capillary: 195 mg/dL — ABNORMAL HIGH (ref 70–99)
Glucose-Capillary: 200 mg/dL — ABNORMAL HIGH (ref 70–99)
Glucose-Capillary: 240 mg/dL — ABNORMAL HIGH (ref 70–99)
Glucose-Capillary: 123 mg/dL — ABNORMAL HIGH (ref 70–99)
Glucose-Capillary: 196 mg/dL — ABNORMAL HIGH (ref 70–99)

## 2020-12-11 LAB — SURGICAL PATHOLOGY

## 2020-12-11 MED ORDER — SODIUM CHLORIDE 0.9 % IV SOLN
100.0000 mg | INTRAVENOUS | Status: DC
  Administered 2020-12-12: 17:00:00 100 mg via INTRAVENOUS
  Filled 2020-12-11 (×2): qty 100

## 2020-12-11 MED ORDER — ENOXAPARIN SODIUM 40 MG/0.4ML ~~LOC~~ SOLN
40.0000 mg | SUBCUTANEOUS | Status: DC
  Administered 2020-12-11 – 2020-12-15 (×4): 40 mg via SUBCUTANEOUS
  Filled 2020-12-11 (×4): qty 0.4

## 2020-12-11 MED ORDER — SODIUM CHLORIDE 0.9 % IV SOLN
200.0000 mg | Freq: Once | INTRAVENOUS | Status: AC
  Administered 2020-12-11: 17:00:00 200 mg via INTRAVENOUS
  Filled 2020-12-11: qty 200

## 2020-12-11 NOTE — Plan of Care (Signed)
  Problem: Activity: Goal: Ability to perform//tolerate increased activity and mobilize with assistive devices will improve Outcome: Progressing   Problem: Pain Management: Goal: Pain level will decrease with appropriate interventions Outcome: Progressing   Problem: Skin Integrity: Goal: Demonstration of wound healing without infection will improve Outcome: Progressing   

## 2020-12-11 NOTE — Consult Note (Signed)
[facility]    Date of Admission:  12/04/2020     Total days of antibiotics 8               Reason for Consult: Acute osteomyelitis   Referring Provider: Alanda Slim Primary Care Provider: Charlott Rakes, MD   ASSESSMENT:  [name] is a 44 y/o male with poorly controlled Type 2 diabetes with acute osteomyelitis s/p partial fifth ray amputation and I&D of complex foot abscess and 3rd metatarsal resection and 3rd partial phalangectomy. Cultures are positive for fungus and gram negative rod with one fungus culture growing candida albicans. Will continue current dose of cefepime and add anidulafungin for fungal coverage pending culture results. Discussed that fungal osteomyelitis will likely require at least 6-12 months of treatment. Will consider oral fluconazole pending additional culture results and sensitivities. Will discuss any potential surgical options that may be available that would aid in source control as current surgery has viable margin with residual osteomyelitis. Discussed the importance of controlling blood sugars and tobacco cessation tas this will aide in healing and decrease the risk of further infection.   PLAN:  1. Continue cefepime and add anidulafungin. 2. Monitor cultures for identification of gram negative rods. 3. Wound care per podiatry. 4. Blood sugar control per primary team.  5. Evaluate further surgical options if present.    Principal Problem:   Acute osteomyelitis of toe, right (HCC) Active Problems:   Gangrene (HCC)   Hypertension   Type 2 diabetes mellitus (HCC)   Cellulitis of right foot   Ulcer of right foot, with necrosis of bone (HCC)   . atorvastatin  40 mg Oral Daily  . enoxaparin (LOVENOX) injection  40 mg Subcutaneous Q24H  .  HYDROmorphone (DILAUDID) injection  2 mg Intravenous Once  . insulin aspart  0-15 Units Subcutaneous TID WC  . insulin aspart  0-5 Units Subcutaneous QHS  . insulin glargine  10  Units Subcutaneous BID  . metoprolol succinate  25 mg Oral Daily     HPI: Benjamin Russo is a 44 y.o. male with medical history of hypertension, tobacco use and Type 2 diabetes admitted from podiatry office where he was being seen for skin changes and chills at night with recommendation for vascular evaluation and surgery.   [name] was initially seen on 11/16 by Dr. Georgiana Shore for swelling of his right foot after being recently treated for cellulitis of left lower extremity. Pulled off a piece of tissue and developed drainage that became foul smelling. Diagnosed with gangrene of the right fourth toe with planned right 5th toe amputation. On follow up exam noted to have open toe wound at the 4th toe with no pus, not much granulation, and black eschar proximally. On 11/30 noted to have some issues with wound healing with recommendation for podiatry evaluation. Seen by Dr. [name] on 12/6 with gangrene and osteomyelitis of the right foot. X-rays showed osteolysis of the 4th metatarsal. He was directly admitted for surgery.   Dr. [name] performed incision and drainage of complex foot abscess below the deep fascia; partial amputation of the fifth ray and metatarsectomy partial fourth metatarsal.  During surgery there was an abscess that tracked plantar medially to the metatarsals and to the plantar aspect of the third metatarsal.  Abscess was evacuated and tissue necrosis was debrided.  There was local necrosis over the third metatarsal head the head itself appeared viable.  Plan return to the OR on 12/11 Dr.  Price performed a third metatarsal resection; third proximal phalanx partial; with application of wound VAC.  ID has been consulted for antibiotic recommendations.    [name] has been afebrile since admission with most recent white blood cell count of 9.2.  Currently on day 8 of antimicrobial therapy with current antibiotics being cefepime and vancomycin. Surgical specimen from 12/11 with no organisms  seen on Gram stain culture growing greater yeast and rare gram-negative rods.  Secondary specimen with no organisms seen on Gram stain and few Candida albicans with rare gram-negative rods on cultures awaiting identification.  Blood cultures from 12/05/2020 without growth.  Mr. Bryson HaSoria's most recent A1c of 11.5 on 10/10/2020.  Most recent A1c completed on 12/8 was 9.1.  Review of Systems: Review of Systems  Constitutional: Negative for chills, fever and weight loss.  Respiratory: Negative for cough, shortness of breath and wheezing.   Cardiovascular: Negative for chest pain and leg swelling.  Gastrointestinal: Negative for abdominal pain, constipation, diarrhea, nausea and vomiting.  Skin: Negative for rash.     Past Medical History:  Diagnosis Date  . Diabetes mellitus without complication (HCC)   . Hypertension     Social History   Tobacco Use  . Smoking status: Current Every Day Smoker    Packs/day: 0.25    Types: Cigarettes  . Smokeless tobacco: Never Used  Vaping Use  . Vaping Use: Never used  Substance Use Topics  . Alcohol use: Yes    Comment: occasional  . Drug use: Never    Family History  Problem Relation Age of Onset  . Diabetes Mellitus II Father   . Diabetes Mellitus II Brother     No Known Allergies  OBJECTIVE: Blood pressure (!) 142/80, pulse 73, temperature (!) 97.5 F (36.4 C), temperature source Oral, resp. rate 16, height 5\' 10"  (1.778 m), weight 87.3 kg, SpO2 100 %.  Physical Exam Constitutional:      General: He is not in acute distress.    Appearance: He is well-developed and well-nourished.  Cardiovascular:     Rate and Rhythm: Normal rate and regular rhythm.     Pulses: Intact distal pulses.     Heart sounds: Normal heart sounds.  Pulmonary:     Effort: Pulmonary effort is normal.     Breath sounds: Normal breath sounds.  Skin:    General: Skin is warm and dry.  Neurological:     Mental Status: He is alert and oriented to person,  place, and time.  Psychiatric:        Mood and Affect: Mood and affect normal.        Behavior: Behavior normal.        Thought Content: Thought content normal.        Judgment: Judgment normal.     Lab Results Lab Results  Component Value Date   WBC 9.2 12/11/2020   HGB 8.3 (L) 12/11/2020   HCT 25.9 (L) 12/11/2020   MCV 74.9 (L) 12/11/2020   PLT 300 12/11/2020    Lab Results  Component Value Date   CREATININE 1.03 12/10/2020   BUN 18 12/10/2020   NA 135 12/10/2020   K 4.3 12/10/2020   CL 103 12/10/2020   CO2 23 12/10/2020    Lab Results  Component Value Date   ALT 26 12/05/2020   AST 19 12/05/2020   ALKPHOS 79 12/05/2020   BILITOT 0.7 12/05/2020     Microbiology: Recent Results (from the past 240 hour(s))  Culture, blood (routine  x 2)     Status: None   Collection Time: 12/05/20  4:14 AM   Specimen: BLOOD  Result Value Ref Range Status   Specimen Description BLOOD RIGHT HAND  Final   Special Requests   Final    BOTTLES DRAWN AEROBIC AND ANAEROBIC Blood Culture results may not be optimal due to an excessive volume of blood received in culture bottles   Culture   Final    NO GROWTH 5 DAYS Performed at [facility], [address], [place], [place] [address]    Report Status 12/10/2020 FINAL  Final  Culture, blood (routine x 2)     Status: None   Collection Time: 12/05/20  4:25 AM   Specimen: BLOOD  Result Value Ref Range Status   Specimen Description BLOOD LEFT FOREARM  Final   Special Requests   Final    BOTTLES DRAWN AEROBIC AND ANAEROBIC Blood Culture results may not be optimal due to an excessive volume of blood received in culture bottles   Culture   Final    NO GROWTH 5 DAYS Performed at [facility], [address], [place], [place] [address]    Report Status 12/10/2020 FINAL  Final  Surgical PCR screen     Status: None   Collection Time: 12/05/20  5:25 AM   Specimen: Nasal Mucosa; Nasal Swab  Result Value Ref Range Status   MRSA, PCR  NEGATIVE NEGATIVE Final   Staphylococcus aureus NEGATIVE NEGATIVE Final    Comment: (NOTE) The Xpert SA Assay (FDA approved for NASAL specimens in patients 32 years of age and older), is one component of a comprehensive surveillance program. It is not intended to diagnose infection nor to guide or monitor treatment. Performed at [facility], [address] [place], [place] [address]   Resp Panel by RT-PCR (Flu A&B, Covid) Nasopharyngeal Swab     Status: None   Collection Time: 12/06/20  8:13 AM   Specimen: Nasopharyngeal Swab; Nasopharyngeal(NP) swabs in vial transport medium  Result Value Ref Range Status   SARS Coronavirus 2 by RT PCR NEGATIVE NEGATIVE Final    Comment: (NOTE) SARS-CoV-2 target nucleic acids are NOT DETECTED.  The SARS-CoV-2 RNA is generally detectable in upper respiratory specimens during the acute phase of infection. The lowest concentration of SARS-CoV-2 viral copies this assay can detect is 138 copies/mL. A negative result does not preclude SARS-Cov-2 infection and should not be used as the sole basis for treatment or other patient management decisions. A negative result may occur with  improper specimen collection/handling, submission of specimen other than nasopharyngeal swab, presence of viral mutation(s) within the areas targeted by this assay, and inadequate number of viral copies(<138 copies/mL). A negative result must be combined with clinical observations, patient history, and epidemiological information. The expected result is Negative.  Fact Sheet for Patients:  BloggerCourse.com  Fact Sheet for Healthcare Providers:  SeriousBroker.it  This test is no t yet approved or cleared by the [place] FDA and  has been authorized for detection and/or diagnosis of SARS-CoV-2 by FDA under an Emergency Use Authorization (EUA). This EUA will remain  in effect (meaning this test can be used) for  the duration of the COVID-19 declaration under Section 564(b)(1) of the Act, 21 U.S.C.section 360bbb-3(b)(1), unless the authorization is terminated  or revoked sooner.       Influenza A by PCR NEGATIVE NEGATIVE Final   Influenza B by PCR NEGATIVE NEGATIVE Final    Comment: (  NOTE) The Xpert Xpress SARS-CoV-2/FLU/RSV plus assay is intended as an aid in the diagnosis of influenza from Nasopharyngeal swab specimens and should not be used as a sole basis for treatment. Nasal washings and aspirates are unacceptable for Xpert Xpress SARS-CoV-2/FLU/RSV testing.  Fact Sheet for Patients: BloggerCourse.com  Fact Sheet for Healthcare Providers: SeriousBroker.it  This test is not yet approved or cleared by the [place] FDA and has been authorized for detection and/or diagnosis of SARS-CoV-2 by FDA under an Emergency Use Authorization (EUA). This EUA will remain in effect (meaning this test can be used) for the duration of the COVID-19 declaration under Section 564(b)(1) of the Act, 21 U.S.C. section 360bbb-3(b)(1), unless the authorization is terminated or revoked.  Performed at [facility], [address], [place], [place] [address]   Aerobic/Anaerobic Culture (surgical/deep wound)     Status: None (Preliminary result)   Collection Time: 12/09/20 10:04 AM   Specimen: Bone; Tissue  Result Value Ref Range Status   Specimen Description BONE  Final   Special Requests NONE  Final   Gram Stain   Final    NO WBC SEEN NO ORGANISMS SEEN Performed at [facility], [address], [place], [place] [address]    Culture RARE YEAST RARE GRAM NEGATIVE RODS   Final   Report Status PENDING  Incomplete  Aerobic/Anaerobic Culture (surgical/deep wound)     Status: None (Preliminary result)   Collection Time: 12/09/20 10:06 AM   Specimen: Foot, Right; Wound  Result Value Ref Range Status   Specimen Description FOOT  Final    Special Requests RIGHT  Final   Gram Stain NO WBC SEEN NO ORGANISMS SEEN   Final   Culture   Final    FEW YEAST RARE GRAM NEGATIVE RODS IDENTIFICATION AND SUSCEPTIBILITIES TO FOLLOW Performed at [facility], [address], [place], [place] [address]    Report Status PENDING  Incomplete     Marcos Eke, NP [facility]  12/11/2020  2:00 PM

## 2020-12-11 NOTE — Progress Notes (Signed)
Wound vac- approximately 

## 2020-12-11 NOTE — Progress Notes (Signed)
Subjective:  Patient ID: Benjamin Russo, male    DOB: [birth year],  MRN: [id number]  Patient seen bedside. Denies new complaints.  Objective:   Vitals:   12/10/20 1931 12/11/20 0355  BP: 136/74 129/61  Pulse: 85 75  Resp: 14 15  Temp: 99.1 F (37.3 C) 98.1 F (36.7 C)  SpO2: 97% 99%   Dressing intact RLE. 25 ccs ss drainage in cannister. Good seal noted.  Results for orders placed or performed during the hospital encounter of 12/04/20  Culture, blood (routine x 2)     Status: None   Collection Time: 12/05/20  4:14 AM   Specimen: BLOOD  Result Value Ref Range Status   Specimen Description BLOOD RIGHT HAND  Final   Special Requests   Final    BOTTLES DRAWN AEROBIC AND ANAEROBIC Blood Culture results may not be optimal due to an excessive volume of blood received in culture bottles   Culture   Final    NO GROWTH 5 DAYS Performed at [facility], [address] [place], [place] [address]    Report Status 12/10/2020 FINAL  Final  Culture, blood (routine x 2)     Status: None   Collection Time: 12/05/20  4:25 AM   Specimen: BLOOD  Result Value Ref Range Status   Specimen Description BLOOD LEFT FOREARM  Final   Special Requests   Final    BOTTLES DRAWN AEROBIC AND ANAEROBIC Blood Culture results may not be optimal due to an excessive volume of blood received in culture bottles   Culture   Final    NO GROWTH 5 DAYS Performed at [facility], [address], [place], [place] [address]    Report Status 12/10/2020 FINAL  Final  Surgical PCR screen     Status: None   Collection Time: 12/05/20  5:25 AM   Specimen: Nasal Mucosa; Nasal Swab  Result Value Ref Range Status   MRSA, PCR NEGATIVE NEGATIVE Final   Staphylococcus aureus NEGATIVE NEGATIVE Final    Comment: (NOTE) The Xpert SA Assay (FDA approved for NASAL specimens in patients 84 years of age and older), is one component of a comprehensive surveillance program. It is not intended to diagnose infection nor  to guide or monitor treatment. Performed at [facility], [address], [place], [place] [address]   Resp Panel by RT-PCR (Flu A&B, Covid) Nasopharyngeal Swab     Status: None   Collection Time: 12/06/20  8:13 AM   Specimen: Nasopharyngeal Swab; Nasopharyngeal(NP) swabs in vial transport medium  Result Value Ref Range Status   SARS Coronavirus 2 by RT PCR NEGATIVE NEGATIVE Final    Comment: (NOTE) SARS-CoV-2 target nucleic acids are NOT DETECTED.  The SARS-CoV-2 RNA is generally detectable in upper respiratory specimens during the acute phase of infection. The lowest concentration of SARS-CoV-2 viral copies this assay can detect is 138 copies/mL. A negative result does not preclude SARS-Cov-2 infection and should not be used as the sole basis for treatment or other patient management decisions. A negative result may occur with  improper specimen collection/handling, submission of specimen other than nasopharyngeal swab, presence of viral mutation(s) within the areas targeted by this assay, and inadequate number of viral copies(<138 copies/mL). A negative result must be combined with clinical observations, patient history, and epidemiological information. The expected result is Negative.  Fact Sheet for Patients:  BloggerCourse.com  Fact Sheet for Healthcare Providers:  SeriousBroker.it  This test is no t yet approved  or cleared by the [place] and  has been authorized for detection and/or diagnosis of SARS-CoV-2 by FDA under an Emergency Use Authorization (EUA). This EUA will remain  in effect (meaning this test can be used) for the duration of the COVID-19 declaration under Section 564(b)(1) of the Act, 21 U.S.C.section 360bbb-3(b)(1), unless the authorization is terminated  or revoked sooner.       Influenza A by PCR NEGATIVE NEGATIVE Final   Influenza B by PCR NEGATIVE NEGATIVE Final    Comment:  (NOTE) The Xpert Xpress SARS-CoV-2/FLU/RSV plus assay is intended as an aid in the diagnosis of influenza from Nasopharyngeal swab specimens and should not be used as a sole basis for treatment. Nasal washings and aspirates are unacceptable for Xpert Xpress SARS-CoV-2/FLU/RSV testing.  Fact Sheet for Patients: BloggerCourse.com  Fact Sheet for Healthcare Providers: SeriousBroker.it  This test is not yet approved or cleared by the [place] FDA and has been authorized for detection and/or diagnosis of SARS-CoV-2 by FDA under an Emergency Use Authorization (EUA). This EUA will remain in effect (meaning this test can be used) for the duration of the COVID-19 declaration under Section 564(b)(1) of the Act, 21 U.S.C. section 360bbb-3(b)(1), unless the authorization is terminated or revoked.  Performed at [facility], [address] [place], [place] [address]   Aerobic/Anaerobic Culture (surgical/deep wound)     Status: None (Preliminary result)   Collection Time: 12/09/20 10:04 AM   Specimen: Bone; Tissue  Result Value Ref Range Status   Specimen Description BONE  Final   Special Requests NONE  Final   Gram Stain   Final    NO WBC SEEN NO ORGANISMS SEEN Performed at [facility], [address], [place], [place] [address]    Culture RARE YEAST RARE GRAM NEGATIVE RODS   Final   Report Status PENDING  Incomplete  Aerobic/Anaerobic Culture (surgical/deep wound)     Status: None (Preliminary result)   Collection Time: 12/09/20 10:06 AM   Specimen: Foot, Right; Wound  Result Value Ref Range Status   Specimen Description FOOT  Final   Special Requests RIGHT  Final   Gram Stain NO WBC SEEN NO ORGANISMS SEEN   Final   Culture   Final    FEW YEAST CULTURE REINCUBATED FOR BETTER GROWTH Performed at [facility], [address], [place], [place] [address]    Report Status PENDING  Incomplete    Assessment  & Plan:  Patient was evaluated and treated and all questions answered.  Gangrene right foot, osteomyelitis -Continue IV Abx. -Wound growing yeast, GNRs. Recommend ID consult. Will likely need 6 weeks IV abx for viable margin with residual OM. [facility] dressing to be changed today. -Ok for d/c once abx plan in place. No dressing changes needed at home. Will keep dressing that is applied today until office follow-up.  Park Liter, DPM  Best accessible via secure chat for questions or concerns.

## 2020-12-11 NOTE — Progress Notes (Signed)
PROGRESS NOTE  Benjamin Russo SWN:[id number] DOB: [birth year]   PCP: Maryella Shivers, MD  Patient is from: Home  DOA: 12/04/2020 LOS: 7  Chief complaints: Right foot infection and wound  Brief Narrative / Interim history: 44 year old male with history of IDDM-2, tobacco use, noncompliance, HTN, anemia and recent right fourth toe amputation for possible gangrene about 2 weeks ago admitted from podiatry office, Dr. March Rummage with concern for osteomyelitis and gangrene in right fifth toe and PAD.  Patient has had discoloration and discharge from fifth toe for about a week.  Also subjective fever, chills and night sweats intermittently.  Per podiatry note, had x-ray concerning for bone erosion.  He was started on vancomycin and cefepime, and admitted. ABI with noncompressible BLE arteries and significantly reduced TBI.  Vascular surgery consulted, and recommended medical management for his PAD.  MRI of his right foot concerning for osteomyelitis in his third toe, third through fifth metatarsal bones, and diffuse soft tissue inflammation/infection in the right foot.  Patient underwent I&D, fifth ray partial amputation and metatarsectomy fourth partial metatarsal on 12/07/2020.  He returned to OR and had third metatarsal head resection, debridement and delayed wound closure by Dr. March Rummage on 12/09/2020.  Surgical tissue culture with few yeast and GNR in both samples.  Speciation pending.  Infectious disease consulted.  Subjective: Seen and examined earlier this morning.  No major events overnight of this morning.  No complaints.  He denies pain.   Objective: Vitals:   12/10/20 1931 12/11/20 0355 12/11/20 0926 12/11/20 1048  BP: 136/74 129/61 (!) 142/80 (!) 142/80  Pulse: 85 75 73 73  Resp: _0 Temp: 99.1 F (37.3 C) 98.1 F (36.7 C) (!) 97.5 F (36.4 C)   TempSrc: Oral Oral Oral   SpO2: 97% 99% 100%   Weight:      Height:        Intake/Output Summary (Last 24 hours) at 12/11/2020  1251 Last data filed at 12/11/2020 0600 Gross per 24 hour  Intake 1330 ml  Output 3420 ml  Net -2090 ml   Filed Weights   12/04/20 2111 12/07/20 1758  Weight: 87.3 kg 87.3 kg    Examination:  GENERAL: No apparent distress.  Nontoxic. HEENT: MMM.  Vision and hearing grossly intact.  NECK: Supple.  No apparent JVD.  RESP:  No IWOB.  Fair aeration bilaterally. CVS:  RRR. Heart sounds normal.  ABD/GI/GU: BS+. Abd soft, NTND.  MSK/EXT:  Moves extremities.  No swelling or erythema proximal to Ace wrap SKIN: Ace wrap over right foot.  Small serosanguineous fluid in wound VAC canister. NEURO: Awake, alert and oriented appropriately.  No apparent focal neuro deficit. PSYCH: Calm. Normal affect.  Procedures:  12/07/2020-I&D, fifth ray partial amputation and metatarsectomy fourth partial metatarsal by Dr. March Rummage 12/09/2020-third metatarsal head resection, debridement and delayed wound closure by Dr. March Rummage   Microbiology summarized: COVID-19 and influenza PCR nonreactive Blood cultures NGTD. MRSA PCR nonreactive. Surgical tissue culture with few yeast and rare GNR  Assessment & Plan: Cellulitis with possible osteomyelitis and gangrene of right fifth toe Recent right first toe amputation for possible gangrene Bilateral PAD with noncompressible arteries. -ABI with noncompressible arteries in BLE and severely reduced TBI bilaterally -MRI  concerning for osteo in rt third toe, third through fifth metatarsal bones, and myositis/cellulitis. -Vascular surgery recommended medical management for his PAD -Surgical procedures as above.  Surgical culture as above.  Blood cultures NGTD. -CRP 10.  ESR 108. -On vancomycin and cefepime.  Monitor CRP and ESR intermittently -Continue high intensity statin.  Start p.o. aspirin if no plan for further surgery -Infectious disease consulted. -Discussed about importance of compliance, tobacco cessation, daily walking and diabetic control  Uncontrolled  IDDM-2 with hyperglycemia: A1c 9.1%.  Seems to be on Lantus 25 units nightly and Metformin 500 mg twice daily at home. Recent Labs  Lab 12/10/20 1610 12/10/20 2003 12/10/20 2355 12/11/20 0357 12/11/20 1154  GLUCAP 140* 204* 165* 187* 173*  -Increased Lantus to 10 units twice daily on 12/10 -Increased SSI to moderate on 12/10 -Add NovoLog AC 3 units -Further adjustment as appropriate -Continue atorvastatin  Essential hypertension: Normotensive. -Continue home metoprolol.  Microcytic anemia: B/l Hgb 11-12>> 8.7>> 7.9> 8.3.  Denies melena or hematochezia.  Anemia panel favors anemia of chronic disease but difficult to interpret with ongoing infection.  He is not symptomatic. -Continue monitoring -Transfuse for Hgb less than 7.0 -Recheck anemia panel in a day or 2  Tobacco use disorder: Reports smoking about 2 cigarettes a day. -Discussed the importance of tobacco cessation and encouraged -Nicotine patch as needed  Hyponatremia: Likely due to hyperglycemia.  Resolved. -Manage hyperglycemia as above.  Leukocytosis with bandemia: Likely due to #1.  Resolved.  Body mass index is 27.62 kg/m.         DVT prophylaxis:  SCDs Start: 12/05/20 0140  Code Status: Full code Family Communication: Patient and/or RN. Available if any question.  Status is: Inpatient  Remains inpatient appropriate because:IV treatments appropriate due to intensity of illness or inability to take PO and Inpatient level of care appropriate due to severity of illness   Dispo: The patient is from: Home              Anticipated d/c is to: Home              Anticipated d/c date is: 2 days              Patient currently is not medically stable to d/c.       Consultants:  Podiatry-following Vascular surgery-signed off Infectious disease   Sch Meds:  Scheduled Meds: . atorvastatin  40 mg Oral Daily  .  HYDROmorphone (DILAUDID) injection  2 mg Intravenous Once  . insulin aspart  0-15 Units  Subcutaneous TID WC  . insulin aspart  0-5 Units Subcutaneous QHS  . insulin aspart  11 Units Subcutaneous Once  . insulin glargine  10 Units Subcutaneous BID  . metoprolol succinate  25 mg Oral Daily   Continuous Infusions: . ceFEPime (MAXIPIME) IV 2 g (12/11/20 1051)  . vancomycin 1,250 mg (12/11/20 0618)   PRN Meds:.acetaminophen **OR** acetaminophen  Antimicrobials: Anti-infectives (From admission, onward)   Start     Dose/Rate Route Frequency Ordered Stop   12/10/20 0645  vancomycin (VANCOREADY) IVPB 1250 mg/250 mL        1,250 mg 166.7 mL/hr over 90 Minutes Intravenous Every 12 hours 12/10/20 0546     12/08/20 0600  vancomycin (VANCOCIN) IVPB 1000 mg/200 mL premix  Status:  Discontinued        1,000 mg 200 mL/hr over 60 Minutes Intravenous Every 12 hours 12/07/20 1154 12/10/20 0546   12/07/20 1857  vancomycin (VANCOCIN) powder  Status:  Discontinued          As needed 12/07/20 1900 12/07/20 1935   12/05/20 1000  vancomycin (VANCOCIN) IVPB 1000 mg/200 mL premix  Status:  Discontinued        1,000 mg 200 mL/hr over 60 Minutes  Intravenous Every 8 hours 12/05/20 0525 12/07/20 1154   12/05/20 1000  ceFEPIme (MAXIPIME) 2 g in sodium chloride 0.9 % 100 mL IVPB        2 g 200 mL/hr over 30 Minutes Intravenous Every 8 hours 12/05/20 0525     12/05/20 0230  vancomycin (VANCOREADY) IVPB 1500 mg/300 mL        1,500 mg 150 mL/hr over 120 Minutes Intravenous  Once 12/05/20 0142 12/05/20 0447   12/05/20 0230  ceFEPIme (MAXIPIME) 2 g in sodium chloride 0.9 % 100 mL IVPB        2 g 200 mL/hr over 30 Minutes Intravenous  Once 12/05/20 0142 12/05/20 0314       I have personally reviewed the following labs and images: CBC: Recent Labs  Lab 12/05/20 0425 12/06/20 0327 12/08/20 0350 12/09/20 0259 12/10/20 0450 12/11/20 0041  WBC 10.3 12.2* 12.4* 11.6* 10.5 9.2  NEUTROABS 6.9 9.0* 11.2*  --  7.4  --   HGB 8.7* 9.1* 8.5* 7.9* 7.9* 8.3*  HCT 25.5* 27.1* 26.2* 23.0* 24.9* 25.9*  MCV  73.5* 73.6* 74.4* 73.2* 74.6* 74.9*  PLT 323 332 309 323 299 300   BMP &GFR Recent Labs  Lab 12/07/20 1025 12/08/20 0350 12/08/20 0556 12/09/20 0259 12/10/20 0450  NA 137 132* 132* 137 135  K 4.5 4.5 4.6 4.4 4.3  CL 105 99 100 106 103  CO2 22 21* _0 GLUCOSE 104* 427* 378* 193* 159*  BUN 17 22* 21* 19 18  CREATININE 1.07 1.31* 1.31* 0.98 1.03  CALCIUM 8.9 8.6* 8.4* 8.6* 8.8*  MG  --  1.8  --  1.9 1.7  PHOS  --  2.2*  --  2.9 3.4   Estimated Creatinine Clearance: 96.5 mL/min (by C-G formula based on SCr of 1.03 mg/dL). Liver & Pancreas: Recent Labs  Lab 12/05/20 0425 12/08/20 0350 12/09/20 0259 12/10/20 0450  AST 19  --   --   --   ALT 26  --   --   --   ALKPHOS 79  --   --   --   BILITOT 0.7  --   --   --   PROT 7.1  --   --   --   ALBUMIN 2.7* 2.5* 2.4* 2.4*   No results for input(s): LIPASE, AMYLASE in the last 168 hours. No results for input(s): AMMONIA in the last 168 hours. Diabetic: No results for input(s): HGBA1C in the last 72 hours. Recent Labs  Lab 12/10/20 1610 12/10/20 2003 12/10/20 2355 12/11/20 0357 12/11/20 1154  GLUCAP 140* 204* 165* 187* 173*   Cardiac Enzymes: No results for input(s): CKTOTAL, CKMB, CKMBINDEX, TROPONINI in the last 168 hours. No results for input(s): PROBNP in the last 8760 hours. Coagulation Profile: No results for input(s): INR, PROTIME in the last 168 hours. Thyroid Function Tests: No results for input(s): TSH, T4TOTAL, FREET4, T3FREE, THYROIDAB in the last 72 hours. Lipid Profile: No results for input(s): CHOL, HDL, LDLCALC, TRIG, CHOLHDL, LDLDIRECT in the last 72 hours. Anemia Panel: No results for input(s): VITAMINB12, FOLATE, FERRITIN, TIBC, IRON, RETICCTPCT in the last 72 hours. Urine analysis: No results found for: COLORURINE, APPEARANCEUR, LABSPEC, PHURINE, GLUCOSEU, HGBUR, BILIRUBINUR, KETONESUR, PROTEINUR, UROBILINOGEN, NITRITE, LEUKOCYTESUR Sepsis Labs: Invalid input(s): PROCALCITONIN,  [place]  Microbiology: Recent Results (from the past 240 hour(s))  Culture, blood (routine x 2)     Status: None   Collection Time: 12/05/20  4:14 AM   Specimen: BLOOD  Result Value Ref Range Status   Specimen Description BLOOD RIGHT HAND  Final   Special Requests   Final    BOTTLES DRAWN AEROBIC AND ANAEROBIC Blood Culture results may not be optimal due to an excessive volume of blood received in culture bottles   Culture   Final    NO GROWTH 5 DAYS Performed at [facility] [address], [place], [place] [address]    Report Status 12/10/2020 FINAL  Final  Culture, blood (routine x 2)     Status: None   Collection Time: 12/05/20  4:25 AM   Specimen: BLOOD  Result Value Ref Range Status   Specimen Description BLOOD LEFT FOREARM  Final   Special Requests   Final    BOTTLES DRAWN AEROBIC AND ANAEROBIC Blood Culture results may not be optimal due to an excessive volume of blood received in culture bottles   Culture   Final    NO GROWTH 5 DAYS Performed at [facility], [place] [address], [place], [place] [address]    Report Status 12/10/2020 FINAL  Final  Surgical PCR screen     Status: None   Collection Time: 12/05/20  5:25 AM   Specimen: Nasal Mucosa; Nasal Swab  Result Value Ref Range Status   MRSA, PCR NEGATIVE NEGATIVE Final   Staphylococcus aureus NEGATIVE NEGATIVE Final    Comment: (NOTE) The Xpert SA Assay (FDA approved for NASAL specimens in patients 57 years of age and older), is one component of a comprehensive surveillance program. It is not intended to diagnose infection nor to guide or monitor treatment. Performed at [facility], [place] [address] [place], [place] [address]   Resp Panel by RT-PCR (Flu A&B, Covid) Nasopharyngeal Swab     Status: None   Collection Time: 12/06/20  8:13 AM   Specimen: Nasopharyngeal Swab; Nasopharyngeal(NP) swabs in vial transport medium  Result Value Ref Range Status   SARS Coronavirus 2 by RT PCR NEGATIVE  NEGATIVE Final    Comment: (NOTE) SARS-CoV-2 target nucleic acids are NOT DETECTED.  The SARS-CoV-2 RNA is generally detectable in upper respiratory specimens during the acute phase of infection. The lowest concentration of SARS-CoV-2 viral copies this assay can detect is 138 copies/mL. A negative result does not preclude SARS-Cov-2 infection and should not be used as the sole basis for treatment or other patient management decisions. A negative result may occur with  improper specimen collection/handling, submission of specimen other than nasopharyngeal swab, presence of viral mutation(s) within the areas targeted by this assay, and inadequate number of viral copies(<138 copies/mL). A negative result must be combined with clinical observations, patient history, and epidemiological information. The expected result is Negative.  Fact Sheet for Patients:  EntrepreneurPulse.com.au  Fact Sheet for Healthcare Providers:  IncredibleEmployment.be  This test is no t yet approved or cleared by the [place] FDA and  has been authorized for detection and/or diagnosis of SARS-CoV-2 by FDA under an Emergency Use Authorization (EUA). This EUA will remain  in effect (meaning this test can be used) for the duration of the COVID-19 declaration under Section 564(b)(1) of the Act, 21 U.S.C.section 360bbb-3(b)(1), unless the authorization is terminated  or revoked sooner.       Influenza A by PCR NEGATIVE NEGATIVE Final   Influenza B by PCR NEGATIVE NEGATIVE Final    Comment: (NOTE) The Xpert Xpress SARS-CoV-2/FLU/RSV plus assay is intended as an aid in the diagnosis of influenza from Nasopharyngeal swab specimens and  should not be used as a sole basis for treatment. Nasal washings and aspirates are unacceptable for Xpert Xpress SARS-CoV-2/FLU/RSV testing.  Fact Sheet for Patients: EntrepreneurPulse.com.au  Fact Sheet for Healthcare  Providers: IncredibleEmployment.be  This test is not yet approved or cleared by the [place] FDA and has been authorized for detection and/or diagnosis of SARS-CoV-2 by FDA under an Emergency Use Authorization (EUA). This EUA will remain in effect (meaning this test can be used) for the duration of the COVID-19 declaration under Section 564(b)(1) of the Act, 21 U.S.C. section 360bbb-3(b)(1), unless the authorization is terminated or revoked.  Performed at [facility], [place] [address], [place], [place] [address]   Aerobic/Anaerobic Culture (surgical/deep wound)     Status: None (Preliminary result)   Collection Time: 12/09/20 10:04 AM   Specimen: Bone; Tissue  Result Value Ref Range Status   Specimen Description BONE  Final   Special Requests NONE  Final   Gram Stain   Final    NO WBC SEEN NO ORGANISMS SEEN Performed at [facility], [place] [address], [place], [place] [address]    Culture RARE YEAST RARE GRAM NEGATIVE RODS   Final   Report Status PENDING  Incomplete  Aerobic/Anaerobic Culture (surgical/deep wound)     Status: None (Preliminary result)   Collection Time: 12/09/20 10:06 AM   Specimen: Foot, Right; Wound  Result Value Ref Range Status   Specimen Description FOOT  Final   Special Requests RIGHT  Final   Gram Stain NO WBC SEEN NO ORGANISMS SEEN   Final   Culture   Final    FEW YEAST RARE GRAM NEGATIVE RODS IDENTIFICATION AND SUSCEPTIBILITIES TO FOLLOW Performed at [facility], [address], [place], [place] [address]    Report Status PENDING  Incomplete    Radiology Studies: No results found.   Carmel [place] T. [place]  If 7PM-7AM, please contact night-coverage [email or web address] 12/11/2020, 12:51 PM

## 2020-12-11 NOTE — Consult Note (Addendum)
WOC Nurse Consult Note: Reason for Consult: change incisional NPWT  Surgical wound right foot; amputation with I &D right 4th toe and gangrene of the right fifth toe Wound type: surgical  Pressure Injury POA: NA Measurement:11 cm x 3cm x 1.5cm  (incision with sutures, opening just lateral to remaining toes, superficial skin loss on the plantar surface of the foot along the incision at 7-8 o'clock  Wound bed: see above, can not visualize tunneled area base, some superficial skin loss that is clean and pink, moist Drainage (amount, consistency, odor) scant in VAC canister Periwound:intact, skin peeling  Dressing procedure/placement/frequency:  Removed old NPWT dressing Protected sutured incision with Mepitel Used small piece of ostomy barrier ring around the existing toe to gain a seal with NPWT dressing Covered suture line with_1__ piece of black foam. Tucking small portion of the black foam into the tunneled area  Sealed NPWT dressing at HG Wrapped foot and ankle with ACE wrap to protect site and keep tubing from being pulled.  Patient tolerated procedure well; patient with neuropathic foot changes that limit his ability to feel pain in this area No further dressing changes ordered, to be placed to Prevena unit for DC to home.  Patient is to be partial weight bearing; using walker.    Discussed POC with patient and bedside nurse.  Re consult if needed, will not follow at this time. Thanks  Guido [employer], RN,CWOCN, CNS, CWON-AP [phone])

## 2020-12-12 DIAGNOSIS — B9620 Unspecified Escherichia coli [E. coli] as the cause of diseases classified elsewhere: Secondary | ICD-10-CM

## 2020-12-12 DIAGNOSIS — Z9889 Other specified postprocedural states: Secondary | ICD-10-CM

## 2020-12-12 DIAGNOSIS — G8918 Other acute postprocedural pain: Secondary | ICD-10-CM

## 2020-12-12 LAB — GLUCOSE, CAPILLARY
Glucose-Capillary: 158 mg/dL — ABNORMAL HIGH (ref 70–99)
Glucose-Capillary: 155 mg/dL — ABNORMAL HIGH (ref 70–99)
Glucose-Capillary: 212 mg/dL — ABNORMAL HIGH (ref 70–99)

## 2020-12-12 LAB — SURGICAL PATHOLOGY

## 2020-12-12 MED ORDER — INSULIN ASPART 100 UNIT/ML ~~LOC~~ SOLN
0.0000 [IU] | Freq: Three times a day (TID) | SUBCUTANEOUS | Status: DC
  Administered 2020-12-12 – 2020-12-13 (×3): 3 [IU] via SUBCUTANEOUS
  Administered 2020-12-14 (×2): 2 [IU] via SUBCUTANEOUS
  Administered 2020-12-14 – 2020-12-15 (×2): 3 [IU] via SUBCUTANEOUS

## 2020-12-12 MED ORDER — INSULIN ASPART 100 UNIT/ML ~~LOC~~ SOLN
3.0000 [IU] | Freq: Three times a day (TID) | SUBCUTANEOUS | Status: DC
  Administered 2020-12-12 – 2020-12-15 (×7): 3 [IU] via SUBCUTANEOUS

## 2020-12-12 MED ORDER — INSULIN ASPART 100 UNIT/ML ~~LOC~~ SOLN
0.0000 [IU] | Freq: Every day | SUBCUTANEOUS | Status: DC
  Administered 2020-12-12: 23:00:00 2 [IU] via SUBCUTANEOUS

## 2020-12-12 MED ORDER — ENSURE PRE-SURGERY PO LIQD
296.0000 mL | Freq: Once | ORAL | Status: AC
  Administered 2020-12-12: 296 mL via ORAL
  Filled 2020-12-12: qty 296

## 2020-12-12 NOTE — Progress Notes (Signed)
PROGRESS NOTE  Benjamin Russo CWC:[id number] DOB: 11-Apr-1976   PCP: Maryella Shivers, MD  Patient is from: Home  DOA: 12/04/2020 LOS: 8  Chief complaints: Right foot infection and wound  Brief Narrative / Interim history: 44 year old male with history of IDDM-2, tobacco use, noncompliance, HTN, anemia and recent right fourth toe amputation for possible gangrene about 2 weeks ago admitted from podiatry office, Dr. March Rummage with concern for osteomyelitis and gangrene in right fifth toe and PAD.  Patient has had discoloration and discharge from fifth toe for about a week.  Also subjective fever, chills and night sweats intermittently.  Per podiatry note, had x-ray concerning for bone erosion.  He was started on vancomycin and cefepime, and admitted. ABI with noncompressible BLE arteries and significantly reduced TBI.  Vascular surgery consulted, and recommended medical management for his PAD.  MRI of his right foot concerning for osteomyelitis in his third toe, third through fifth metatarsal bones, and diffuse soft tissue inflammation/infection in the right foot.  Patient underwent I&D, fifth ray partial amputation and metatarsectomy fourth partial metatarsal on 12/07/2020.  He returned to OR and had third metatarsal head resection, debridement and delayed wound closure by Dr. March Rummage on 12/09/2020.  Surgical tissue culture with few yeast and GNR in both samples.  Speciation pending.  Infectious disease consulted and started vancomycin and added Eraxis.  ID also recommended further debridement.  Subjective: Seen and examined earlier this morning.  No major events overnight of this morning.  No pain.  Sleeping comfortably.  We discussed about ID recommendation and possible further surgical debridement due to concern for fungal infection.  He is waiting to hear from podiatry.  Objective: Vitals:   12/11/20 1430 12/11/20 1942 12/12/20 0344 12/12/20 1051  BP: 139/61 130/79 (!) 141/80 (!) 141/80  Pulse:  77 82 72 72  Resp:  18 18   Temp: 98.4 F (36.9 C) 98 F (36.7 C) 98.2 F (36.8 C)   TempSrc: Oral     SpO2: 100% 100% 99%   Weight:      Height:        Intake/Output Summary (Last 24 hours) at 12/12/2020 1111 Last data filed at 12/11/2020 2200 Gross per 24 hour  Intake 400 ml  Output 500 ml  Net -100 ml   Filed Weights   12/04/20 2111 12/07/20 1758  Weight: 87.3 kg 87.3 kg    Examination:  GENERAL: No apparent distress.  Nontoxic. HEENT: MMM.  Vision and hearing grossly intact.  NECK: Supple.  No apparent JVD.  RESP:  No IWOB.  Fair aeration bilaterally. CVS:  RRR. Heart sounds normal.  ABD/GI/GU: BS+. Abd soft, NTND.  MSK/EXT:  Moves extremities.  No swelling or erythema proximal to Ace wrap. SKIN: Ace wrap over right foot.  Small serosanguineous fluid in wound VAC canister. NEURO: Awake, alert and oriented appropriately.  No apparent focal neuro deficit. PSYCH: Calm. Normal affect.   Procedures:  12/07/2020-I&D, fifth ray partial amputation and metatarsectomy fourth partial metatarsal by Dr. March Rummage 12/09/2020-third metatarsal head resection, debridement and delayed wound closure by Dr. March Rummage   Microbiology summarized: COVID-19 and influenza PCR nonreactive Blood cultures NGTD. MRSA PCR nonreactive. Surgical tissue culture with few yeast and rare GNR  Assessment & Plan: Cellulitis with possible osteomyelitis and gangrene of right fifth toe Recent right first toe amputation for possible gangrene Bilateral PAD with noncompressible arteries. -ABI with noncompressible arteries in BLE and severely reduced TBI bilaterally -MRI  concerning for osteo in rt third toe, third through  fifth metatarsal bones, and myositis/cellulitis. -Vascular surgery recommended medical management for his PAD -Surgical procedures as above.  Surgical culture as above.  Blood cultures NGTD. -CRP 10.  ESR 108.  Monitor intermittently. -ID adjusted antibiotics as below and recommended further  debridement given fungal infection. -Vancomycin 12/7-12/13. -Cefepime 12/7>>>.   -Eraxis 12/13>>> -Monitor CRP and ESR intermittently -Continue high intensity statin.  Start p.o. aspirin if no plan for further surgery -Discussed about importance of compliance, tobacco cessation, daily walking and diabetic control  Uncontrolled IDDM-2 with hyperglycemia: A1c 9.1%.  Seems to be on Lantus 25 units nightly and Metformin 500 mg twice daily at home. Recent Labs  Lab 12/10/20 2355 12/11/20 0357 12/11/20 1154 12/11/20 1703 12/11/20 2218  GLUCAP 165* 187* 173* 123* 196*  -Increased Lantus to 10 units twice daily on 12/10 -Increased SSI to moderate on 12/10 -Added NovoLog AC 3 units. -Further adjustment as appropriate -Continue atorvastatin  Essential hypertension: Normotensive. -Continue home metoprolol.  Microcytic anemia: B/l Hgb 11-12>> 8.7>> 7.9> 8.3.  Denies melena or hematochezia.  Anemia panel favors anemia of chronic disease but difficult to interpret with ongoing infection.  He is not symptomatic. -Continue monitoring -Transfuse for Hgb less than 7.0 -Recheck anemia panel in a day or 2  Tobacco use disorder: Reports smoking about 2 cigarettes a day. -Discussed the importance of tobacco cessation and encouraged -Nicotine patch as needed  Hyponatremia: Likely due to hyperglycemia.  Resolved. -Manage hyperglycemia as above.  Leukocytosis with bandemia: Likely due to #1.  Resolved.  Body mass index is 27.62 kg/m.         DVT prophylaxis:  enoxaparin (LOVENOX) injection 40 mg Start: 12/11/20 1400 SCDs Start: 12/05/20 0140  Code Status: Full code Family Communication: Patient and/or RN. Available if any question.  Status is: Inpatient  Remains inpatient appropriate because:Ongoing diagnostic testing needed not appropriate for outpatient work up, IV treatments appropriate due to intensity of illness or inability to take PO and Inpatient level of care appropriate due to  severity of illness   Dispo: The patient is from: Home              Anticipated d/c is to: Home              Anticipated d/c date is: 3 days              Patient currently is not medically stable to d/c.       Consultants:  Podiatry-following Vascular surgery-signed off Infectious disease   Sch Meds:  Scheduled Meds: . atorvastatin  40 mg Oral Daily  . enoxaparin (LOVENOX) injection  40 mg Subcutaneous Q24H  .  HYDROmorphone (DILAUDID) injection  2 mg Intravenous Once  . insulin aspart  0-15 Units Subcutaneous TID WC  . insulin aspart  0-5 Units Subcutaneous QHS  . insulin aspart  3 Units Subcutaneous TID WC  . insulin glargine  10 Units Subcutaneous BID  . metoprolol succinate  25 mg Oral Daily   Continuous Infusions: . anidulafungin    . ceFEPime (MAXIPIME) IV 2 g (12/12/20 1056)   PRN Meds:.acetaminophen **OR** acetaminophen  Antimicrobials: Anti-infectives (From admission, onward)   Start     Dose/Rate Route Frequency Ordered Stop   12/12/20 1600  anidulafungin (ERAXIS) 100 mg in sodium chloride 0.9 % 100 mL IVPB        100 mg 78 mL/hr over 100 Minutes Intravenous Every 24 hours 12/11/20 1437     12/11/20 1600  anidulafungin (ERAXIS) 200 mg in sodium  chloride 0.9 % 200 mL IVPB        200 mg 78 mL/hr over 200 Minutes Intravenous  Once 12/11/20 1437 12/11/20 2003   12/10/20 0645  vancomycin (VANCOREADY) IVPB 1250 mg/250 mL  Status:  Discontinued        1,250 mg 166.7 mL/hr over 90 Minutes Intravenous Every 12 hours 12/10/20 0546 12/11/20 1437   12/08/20 0600  vancomycin (VANCOCIN) IVPB 1000 mg/200 mL premix  Status:  Discontinued        1,000 mg 200 mL/hr over 60 Minutes Intravenous Every 12 hours 12/07/20 1154 12/10/20 0546   12/07/20 1857  vancomycin (VANCOCIN) powder  Status:  Discontinued          As needed 12/07/20 1900 12/07/20 1935   12/05/20 1000  vancomycin (VANCOCIN) IVPB 1000 mg/200 mL premix  Status:  Discontinued        1,000 mg 200 mL/hr over 60  Minutes Intravenous Every 8 hours 12/05/20 0525 12/07/20 1154   12/05/20 1000  ceFEPIme (MAXIPIME) 2 g in sodium chloride 0.9 % 100 mL IVPB        2 g 200 mL/hr over 30 Minutes Intravenous Every 8 hours 12/05/20 0525     12/05/20 0230  vancomycin (VANCOREADY) IVPB 1500 mg/300 mL        1,500 mg 150 mL/hr over 120 Minutes Intravenous  Once 12/05/20 0142 12/05/20 0447   12/05/20 0230  ceFEPIme (MAXIPIME) 2 g in sodium chloride 0.9 % 100 mL IVPB        2 g 200 mL/hr over 30 Minutes Intravenous  Once 12/05/20 0142 12/05/20 0314       I have personally reviewed the following labs and images: CBC: Recent Labs  Lab 12/06/20 0327 12/08/20 0350 12/09/20 0259 12/10/20 0450 12/11/20 0041  WBC 12.2* 12.4* 11.6* 10.5 9.2  NEUTROABS 9.0* 11.2*  --  7.4  --   HGB 9.1* 8.5* 7.9* 7.9* 8.3*  HCT 27.1* 26.2* 23.0* 24.9* 25.9*  MCV 73.6* 74.4* 73.2* 74.6* 74.9*  PLT 332 309 323 299 300   BMP &GFR Recent Labs  Lab 12/07/20 1025 12/08/20 0350 12/08/20 0556 12/09/20 0259 12/10/20 0450  NA 137 132* 132* 137 135  K 4.5 4.5 4.6 4.4 4.3  CL 105 99 100 106 103  CO2 22 21* $Remo'22 23 23  '[place]$ GLUCOSE 104* 427* 378* 193* 159*  BUN 17 22* 21* 19 18  CREATININE 1.07 1.31* 1.31* 0.98 1.03  CALCIUM 8.9 8.6* 8.4* 8.6* 8.8*  MG  --  1.8  --  1.9 1.7  PHOS  --  2.2*  --  2.9 3.4   Estimated Creatinine Clearance: 96.5 mL/min (by C-G formula based on SCr of 1.03 mg/dL). Liver & Pancreas: Recent Labs  Lab 12/08/20 0350 12/09/20 0259 12/10/20 0450  ALBUMIN 2.5* 2.4* 2.4*   No results for input(s): LIPASE, AMYLASE in the last 168 hours. No results for input(s): AMMONIA in the last 168 hours. Diabetic: No results for input(s): HGBA1C in the last 72 hours. Recent Labs  Lab 12/10/20 2355 12/11/20 0357 12/11/20 1154 12/11/20 1703 12/11/20 2218  GLUCAP 165* 187* 173* 123* 196*   Cardiac Enzymes: No results for input(s): CKTOTAL, CKMB, CKMBINDEX, TROPONINI in the last 168 hours. No results for  input(s): PROBNP in the last 8760 hours. Coagulation Profile: No results for input(s): INR, PROTIME in the last 168 hours. Thyroid Function Tests: No results for input(s): TSH, T4TOTAL, FREET4, T3FREE, THYROIDAB in the last 72 hours. Lipid Profile: No results  for input(s): CHOL, HDL, LDLCALC, TRIG, CHOLHDL, LDLDIRECT in the last 72 hours. Anemia Panel: No results for input(s): VITAMINB12, FOLATE, FERRITIN, TIBC, IRON, RETICCTPCT in the last 72 hours. Urine analysis: No results found for: COLORURINE, APPEARANCEUR, LABSPEC, PHURINE, GLUCOSEU, HGBUR, BILIRUBINUR, KETONESUR, PROTEINUR, UROBILINOGEN, NITRITE, LEUKOCYTESUR Sepsis Labs: Invalid input(s): PROCALCITONIN, [place]  Microbiology: Recent Results (from the past 240 hour(s))  Culture, blood (routine x 2)     Status: None   Collection Time: 12/05/20  4:14 AM   Specimen: BLOOD  Result Value Ref Range Status   Specimen Description BLOOD RIGHT HAND  Final   Special Requests   Final    BOTTLES DRAWN AEROBIC AND ANAEROBIC Blood Culture results may not be optimal due to an excessive volume of blood received in culture bottles   Culture   Final    NO GROWTH 5 DAYS Performed at [facility], [place] [address], [place], [place] [address]    Report Status 12/10/2020 FINAL  Final  Culture, blood (routine x 2)     Status: None   Collection Time: 12/05/20  4:25 AM   Specimen: BLOOD  Result Value Ref Range Status   Specimen Description BLOOD LEFT FOREARM  Final   Special Requests   Final    BOTTLES DRAWN AEROBIC AND ANAEROBIC Blood Culture results may not be optimal due to an excessive volume of blood received in culture bottles   Culture   Final    NO GROWTH 5 DAYS Performed at [facility], [place] [address] [place], [place] [address]    Report Status 12/10/2020 FINAL  Final  Surgical PCR screen     Status: None   Collection Time: 12/05/20  5:25 AM   Specimen: Nasal Mucosa; Nasal Swab  Result Value Ref Range Status   MRSA,  PCR NEGATIVE NEGATIVE Final   Staphylococcus aureus NEGATIVE NEGATIVE Final    Comment: (NOTE) The Xpert SA Assay (FDA approved for NASAL specimens in patients 37 years of age and older), is one component of a comprehensive surveillance program. It is not intended to diagnose infection nor to guide or monitor treatment. Performed at [facility], [place] [address], [place], [place] [address]   Resp Panel by RT-PCR (Flu A&B, Covid) Nasopharyngeal Swab     Status: None   Collection Time: 12/06/20  8:13 AM   Specimen: Nasopharyngeal Swab; Nasopharyngeal(NP) swabs in vial transport medium  Result Value Ref Range Status   SARS Coronavirus 2 by RT PCR NEGATIVE NEGATIVE Final    Comment: (NOTE) SARS-CoV-2 target nucleic acids are NOT DETECTED.  The SARS-CoV-2 RNA is generally detectable in upper respiratory specimens during the acute phase of infection. The lowest concentration of SARS-CoV-2 viral copies this assay can detect is 138 copies/mL. A negative result does not preclude SARS-Cov-2 infection and should not be used as the sole basis for treatment or other patient management decisions. A negative result may occur with  improper specimen collection/handling, submission of specimen other than nasopharyngeal swab, presence of viral mutation(s) within the areas targeted by this assay, and inadequate number of viral copies(<138 copies/mL). A negative result must be combined with clinical observations, patient history, and epidemiological information. The expected result is Negative.  Fact Sheet for Patients:  EntrepreneurPulse.com.au  Fact Sheet for Healthcare Providers:  IncredibleEmployment.be  This test is no t yet approved or cleared by the [place] FDA and  has been authorized for detection and/or diagnosis of SARS-CoV-2 by FDA under an Emergency Use  Authorization (EUA). This EUA will remain  in effect (meaning this test can be used)  for the duration of the COVID-19 declaration under Section 564(b)(1) of the Act, 21 U.S.C.section 360bbb-3(b)(1), unless the authorization is terminated  or revoked sooner.       Influenza A by PCR NEGATIVE NEGATIVE Final   Influenza B by PCR NEGATIVE NEGATIVE Final    Comment: (NOTE) The Xpert Xpress SARS-CoV-2/FLU/RSV plus assay is intended as an aid in the diagnosis of influenza from Nasopharyngeal swab specimens and should not be used as a sole basis for treatment. Nasal washings and aspirates are unacceptable for Xpert Xpress SARS-CoV-2/FLU/RSV testing.  Fact Sheet for Patients: EntrepreneurPulse.com.au  Fact Sheet for Healthcare Providers: IncredibleEmployment.be  This test is not yet approved or cleared by the [place] FDA and has been authorized for detection and/or diagnosis of SARS-CoV-2 by FDA under an Emergency Use Authorization (EUA). This EUA will remain in effect (meaning this test can be used) for the duration of the COVID-19 declaration under Section 564(b)(1) of the Act, 21 U.S.C. section 360bbb-3(b)(1), unless the authorization is terminated or revoked.  Performed at [facility], [place] [address] [place], [place] [address]   Aerobic/Anaerobic Culture (surgical/deep wound)     Status: None (Preliminary result)   Collection Time: 12/09/20 10:04 AM   Specimen: Bone; Tissue  Result Value Ref Range Status   Specimen Description BONE  Final   Special Requests NONE  Final   Gram Stain   Final    NO WBC SEEN NO ORGANISMS SEEN Performed at [facility], [place] [address] [place], [place] [address]    Culture   Final    RARE YEAST RARE GRAM NEGATIVE RODS NO ANAEROBES ISOLATED; CULTURE IN PROGRESS FOR 5 DAYS    Report Status PENDING  Incomplete  Aerobic/Anaerobic Culture (surgical/deep wound)     Status: None (Preliminary result)   Collection Time: 12/09/20 10:06 AM   Specimen: Foot, Right; Wound  Result  Value Ref Range Status   Specimen Description FOOT  Final   Special Requests RIGHT  Final   Gram Stain   Final    NO WBC SEEN NO ORGANISMS SEEN Performed at [facility], [address], [place], [place] [address]    Culture   Final    FEW CANDIDA ALBICANS RARE GRAM NEGATIVE RODS IDENTIFICATION AND SUSCEPTIBILITIES TO FOLLOW NO ANAEROBES ISOLATED; CULTURE IN PROGRESS FOR 5 DAYS    Report Status PENDING  Incomplete    Radiology Studies: No results found.   [place] Paolucci T. [place]  If 7PM-7AM, please contact night-coverage [email or web address] 12/12/2020, 11:11 AM

## 2020-12-12 NOTE — Plan of Care (Signed)
Attempted to call patient to discuss possible surgery. No answer.  Will round on patient over lunch hours today. If amenable, plan for RTOR tomorrow for debridement and repeat cultures.  Park Liter, DPM

## 2020-12-12 NOTE — Plan of Care (Signed)
Problem: Education: Goal: Knowledge of General Education information will improve Description: Including pain rating scale, medication(s)/side effects and non-pharmacologic comfort measures 12/12/2020 0937 by Francetta Found, RN Outcome: Progressing 12/12/2020 0937 by Francetta Found, RN Outcome: Progressing   Problem: Health Behavior/Discharge Planning: Goal: Ability to manage health-related needs will improve 12/12/2020 0937 by Francetta Found, RN Outcome: Progressing 12/12/2020 0937 by Francetta Found, RN Outcome: Progressing   Problem: Clinical Measurements: Goal: Ability to maintain clinical measurements within normal limits will improve 12/12/2020 0937 by Francetta Found, RN Outcome: Progressing 12/12/2020 0937 by Francetta Found, RN Outcome: Progressing Goal: Will remain free from infection 12/12/2020 0937 by Francetta Found, RN Outcome: Progressing 12/12/2020 0937 by Francetta Found, RN Outcome: Progressing Goal: Diagnostic test results will improve 12/12/2020 0937 by Francetta Found, RN Outcome: Progressing 12/12/2020 0937 by Francetta Found, RN Outcome: Progressing Goal: Respiratory complications will improve 12/12/2020 0937 by Francetta Found, RN Outcome: Progressing 12/12/2020 0937 by Francetta Found, RN Outcome: Progressing Goal: Cardiovascular complication will be avoided 12/12/2020 0937 by Francetta Found, RN Outcome: Progressing 12/12/2020 0937 by Francetta Found, RN Outcome: Progressing   Problem: Activity: Goal: Risk for activity intolerance will decrease 12/12/2020 0937 by Francetta Found, RN Outcome: Progressing 12/12/2020 0937 by Francetta Found, RN Outcome: Progressing   Problem: Nutrition: Goal: Adequate nutrition will be maintained 12/12/2020 0937 by Francetta Found, RN Outcome: Progressing 12/12/2020 0937 by Francetta Found, RN Outcome: Progressing   Problem: Coping: Goal: Level of anxiety will decrease 12/12/2020  0937 by Francetta Found, RN Outcome: Progressing 12/12/2020 0937 by Francetta Found, RN Outcome: Progressing   Problem: Elimination: Goal: Will not experience complications related to bowel motility 12/12/2020 0937 by Francetta Found, RN Outcome: Progressing 12/12/2020 0937 by Francetta Found, RN Outcome: Progressing Goal: Will not experience complications related to urinary retention 12/12/2020 0937 by Francetta Found, RN Outcome: Progressing 12/12/2020 0937 by Francetta Found, RN Outcome: Progressing   Problem: Pain Managment: Goal: General experience of comfort will improve 12/12/2020 0937 by Francetta Found, RN Outcome: Progressing 12/12/2020 0937 by Francetta Found, RN Outcome: Progressing   Problem: Safety: Goal: Ability to remain free from injury will improve 12/12/2020 0937 by Francetta Found, RN Outcome: Progressing 12/12/2020 0937 by Francetta Found, RN Outcome: Progressing   Problem: Skin Integrity: Goal: Risk for impaired skin integrity will decrease 12/12/2020 0937 by Francetta Found, RN Outcome: Progressing 12/12/2020 0937 by Francetta Found, RN Outcome: Progressing   Problem: Education: Goal: Knowledge of the prescribed therapeutic regimen will improve 12/12/2020 0937 by Francetta Found, RN Outcome: Progressing 12/12/2020 0937 by Francetta Found, RN Outcome: Progressing Goal: Ability to verbalize activity precautions or restrictions will improve 12/12/2020 0937 by Francetta Found, RN Outcome: Progressing 12/12/2020 0937 by Francetta Found, RN Outcome: Progressing Goal: Understanding of discharge needs will improve 12/12/2020 0937 by Francetta Found, RN Outcome: Progressing 12/12/2020 0937 by Francetta Found, RN Outcome: Progressing   Problem: Activity: Goal: Ability to perform//tolerate increased activity and mobilize with assistive devices will improve 12/12/2020 0937 by Francetta Found, RN Outcome: Progressing 12/12/2020  0937 by Francetta Found, RN Outcome: Progressing   Problem: Clinical Measurements: Goal: Postoperative complications will be avoided or minimized 12/12/2020 0937 by Francetta Found, RN Outcome: Progressing 12/12/2020 0937 by Francetta Found, RN Outcome: Progressing   Problem: Self-Care: Goal: Ability to meet self-care needs will improve 12/12/2020  0932 by Francetta Found, RN Outcome: Progressing 12/12/2020 0937 by Francetta Found, RN Outcome: Progressing   Problem: Self-Concept: Goal: Ability to maintain and perform role responsibilities to the fullest extent possible will improve 12/12/2020 0937 by Francetta Found, RN Outcome: Progressing 12/12/2020 0937 by Francetta Found, RN Outcome: Progressing   Problem: Pain Management: Goal: Pain level will decrease with appropriate interventions 12/12/2020 0937 by Francetta Found, RN Outcome: Progressing 12/12/2020 0937 by Francetta Found, RN Outcome: Progressing   Problem: Skin Integrity: Goal: Demonstration of wound healing without infection will improve 12/12/2020 0937 by Francetta Found, RN Outcome: Progressing 12/12/2020 0937 by Francetta Found, RN Outcome: Progressing

## 2020-12-12 NOTE — Progress Notes (Signed)
[facility]  Date of Admission:  12/04/2020     Total days of antibiotics 9        ASSESSMENT:  [name] surgical specimen cultures remain pending. Dr. Samuella Russo is going to discuss possibility of debridement and repeat cultures. Discussed the continued need for prolonged antibiotic therapy pending surgical procedures/outcomes. Continue current dose of anidulafungin and cefepime.   PLAN:  1. Continue anidulafungin and Cefepime. 2. Monitor cultures for gram negative rod identification. 3. Await surgical plans to determine duration of therapy.    Principal Problem:   Acute osteomyelitis of toe, right (HCC) Active Problems:   Gangrene (HCC)   Hypertension   Type 2 diabetes mellitus (HCC)   Cellulitis of right foot   Ulcer of right foot, with necrosis of bone (HCC)   . atorvastatin  40 mg Oral Daily  . enoxaparin (LOVENOX) injection  40 mg Subcutaneous Q24H  .  HYDROmorphone (DILAUDID) injection  2 mg Intravenous Once  . insulin aspart  0-15 Units Subcutaneous TID WC  . insulin aspart  0-5 Units Subcutaneous QHS  . insulin aspart  3 Units Subcutaneous TID WC  . insulin glargine  10 Units Subcutaneous BID  . metoprolol succinate  25 mg Oral Daily    SUBJECTIVE:  Afebrile overnight with no acute events. Denies fevers or chills.   No Known Allergies   Review of Systems: Review of Systems  Constitutional: Negative for chills, fever and weight loss.  Respiratory: Negative for cough, shortness of breath and wheezing.   Cardiovascular: Negative for chest pain and leg swelling.  Gastrointestinal: Negative for abdominal pain, constipation, diarrhea, nausea and vomiting.  Skin: Negative for rash.      OBJECTIVE: Vitals:   12/11/20 1430 12/11/20 1942 12/12/20 0344 12/12/20 1051  BP: 139/61 130/79 (!) 141/80 (!) 141/80  Pulse: 77 82 72 72  Resp:  18 18   Temp: 98.4 F (36.9 C) 98 F (36.7 C) 98.2 F (36.8 C)   TempSrc: Oral     SpO2:  100% 100% 99%   Weight:      Height:       Body mass index is 27.62 kg/m.  Physical Exam Constitutional:      General: He is not in acute distress.    Appearance: He is well-developed and well-nourished.     Comments: Lying in bed with head of bed elevated; pleasant.   Cardiovascular:     Rate and Rhythm: Normal rate and regular rhythm.     Pulses: Intact distal pulses.     Heart sounds: Normal heart sounds.  Pulmonary:     Effort: Pulmonary effort is normal.     Breath sounds: Normal breath sounds.  Skin:    General: Skin is warm and dry.  Neurological:     Mental Status: He is alert and oriented to person, place, and time.  Psychiatric:        Mood and Affect: Mood and affect normal.        Behavior: Behavior normal.        Thought Content: Thought content normal.        Judgment: Judgment normal.     Lab Results Lab Results  Component Value Date   WBC 9.2 12/11/2020   HGB 8.3 (L) 12/11/2020   HCT 25.9 (L) 12/11/2020   MCV 74.9 (L) 12/11/2020   PLT 300 12/11/2020    Lab Results  Component Value Date   CREATININE 1.03 12/10/2020   BUN  18 12/10/2020   NA 135 12/10/2020   K 4.3 12/10/2020   CL 103 12/10/2020   CO2 23 12/10/2020    Lab Results  Component Value Date   ALT 26 12/05/2020   AST 19 12/05/2020   ALKPHOS 79 12/05/2020   BILITOT 0.7 12/05/2020     Microbiology: Recent Results (from the past 240 hour(s))  Culture, blood (routine x 2)     Status: None   Collection Time: 12/05/20  4:14 AM   Specimen: BLOOD  Result Value Ref Range Status   Specimen Description BLOOD RIGHT HAND  Final   Special Requests   Final    BOTTLES DRAWN AEROBIC AND ANAEROBIC Blood Culture results may not be optimal due to an excessive volume of blood received in culture bottles   Culture   Final    NO GROWTH 5 DAYS Performed at [facility], [address], [place], [place] [address]    Report Status 12/10/2020 FINAL  Final  Culture, blood (routine x 2)      Status: None   Collection Time: 12/05/20  4:25 AM   Specimen: BLOOD  Result Value Ref Range Status   Specimen Description BLOOD LEFT FOREARM  Final   Special Requests   Final    BOTTLES DRAWN AEROBIC AND ANAEROBIC Blood Culture results may not be optimal due to an excessive volume of blood received in culture bottles   Culture   Final    NO GROWTH 5 DAYS Performed at [facility], [address], [place], [place] [address]    Report Status 12/10/2020 FINAL  Final  Surgical PCR screen     Status: None   Collection Time: 12/05/20  5:25 AM   Specimen: Nasal Mucosa; Nasal Swab  Result Value Ref Range Status   MRSA, PCR NEGATIVE NEGATIVE Final   Staphylococcus aureus NEGATIVE NEGATIVE Final    Comment: (NOTE) The Xpert SA Assay (FDA approved for NASAL specimens in patients 71 years of age and older), is one component of a comprehensive surveillance program. It is not intended to diagnose infection nor to guide or monitor treatment. Performed at [facility], [address] [place], [place] [address]   Resp Panel by RT-PCR (Flu A&B, Covid) Nasopharyngeal Swab     Status: None   Collection Time: 12/06/20  8:13 AM   Specimen: Nasopharyngeal Swab; Nasopharyngeal(NP) swabs in vial transport medium  Result Value Ref Range Status   SARS Coronavirus 2 by RT PCR NEGATIVE NEGATIVE Final    Comment: (NOTE) SARS-CoV-2 target nucleic acids are NOT DETECTED.  The SARS-CoV-2 RNA is generally detectable in upper respiratory specimens during the acute phase of infection. The lowest concentration of SARS-CoV-2 viral copies this assay can detect is 138 copies/mL. A negative result does not preclude SARS-Cov-2 infection and should not be used as the sole basis for treatment or other patient management decisions. A negative result may occur with  improper specimen collection/handling, submission of specimen other than nasopharyngeal swab, presence of viral mutation(s) within the areas  targeted by this assay, and inadequate number of viral copies(<138 copies/mL). A negative result must be combined with clinical observations, patient history, and epidemiological information. The expected result is Negative.  Fact Sheet for Patients:  BloggerCourse.com  Fact Sheet for Healthcare Providers:  SeriousBroker.it  This test is no t yet approved or cleared by the [place] FDA and  has been authorized for detection and/or diagnosis of SARS-CoV-2 by FDA under an Emergency  Use Authorization (EUA). This EUA will remain  in effect (meaning this test can be used) for the duration of the COVID-19 declaration under Section 564(b)(1) of the Act, 21 U.S.C.section 360bbb-3(b)(1), unless the authorization is terminated  or revoked sooner.       Influenza A by PCR NEGATIVE NEGATIVE Final   Influenza B by PCR NEGATIVE NEGATIVE Final    Comment: (NOTE) The Xpert Xpress SARS-CoV-2/FLU/RSV plus assay is intended as an aid in the diagnosis of influenza from Nasopharyngeal swab specimens and should not be used as a sole basis for treatment. Nasal washings and aspirates are unacceptable for Xpert Xpress SARS-CoV-2/FLU/RSV testing.  Fact Sheet for Patients: BloggerCourse.com  Fact Sheet for Healthcare Providers: SeriousBroker.it  This test is not yet approved or cleared by the [place] FDA and has been authorized for detection and/or diagnosis of SARS-CoV-2 by FDA under an Emergency Use Authorization (EUA). This EUA will remain in effect (meaning this test can be used) for the duration of the COVID-19 declaration under Section 564(b)(1) of the Act, 21 U.S.C. section 360bbb-3(b)(1), unless the authorization is terminated or revoked.  Performed at [facility], [address] [place], [place] [address]   Aerobic/Anaerobic Culture (surgical/deep wound)     Status: None  (Preliminary result)   Collection Time: 12/09/20 10:04 AM   Specimen: Bone; Tissue  Result Value Ref Range Status   Specimen Description BONE  Final   Special Requests NONE  Final   Gram Stain   Final    NO WBC SEEN NO ORGANISMS SEEN Performed at [facility], [address] [place], [place] [address]    Culture   Final    RARE YEAST RARE GRAM NEGATIVE RODS NO ANAEROBES ISOLATED; CULTURE IN PROGRESS FOR 5 DAYS    Report Status PENDING  Incomplete  Aerobic/Anaerobic Culture (surgical/deep wound)     Status: None (Preliminary result)   Collection Time: 12/09/20 10:06 AM   Specimen: Foot, Right; Wound  Result Value Ref Range Status   Specimen Description FOOT  Final   Special Requests RIGHT  Final   Gram Stain   Final    NO WBC SEEN NO ORGANISMS SEEN Performed at [facility], [address] [place], [place] [address]    Culture   Final    FEW CANDIDA ALBICANS RARE GRAM NEGATIVE RODS IDENTIFICATION AND SUSCEPTIBILITIES TO FOLLOW NO ANAEROBES ISOLATED; CULTURE IN PROGRESS FOR 5 DAYS    Report Status PENDING  Incomplete     Marcos Eke, NP [facility]  12/12/2020  11:31 AM

## 2020-12-12 NOTE — Progress Notes (Signed)
Subjective:  Patient ID: Benjamin Russo, male    DOB: 1976/04/07,  MRN: [id number]  Patient seen bedside. While he wants to go home he understands he will do what is necessary for his foot to heal.. Objective:   Vitals:   12/12/20 1051 12/12/20 1947  BP: (!) 141/80 130/80  Pulse: 72 77  Resp:  18  Temp:  98.3 F (36.8 C)  SpO2:  99%   Dressing intact RLE. Minimal drainage in cannister. Good seal noted.  Results for orders placed or performed during the hospital encounter of 12/04/20  Culture, blood (routine x 2)     Status: None   Collection Time: 12/05/20  4:14 AM   Specimen: BLOOD  Result Value Ref Range Status   Specimen Description BLOOD RIGHT HAND  Final   Special Requests   Final    BOTTLES DRAWN AEROBIC AND ANAEROBIC Blood Culture results may not be optimal due to an excessive volume of blood received in culture bottles   Culture   Final    NO GROWTH 5 DAYS Performed at [facility], [address] [place], [place] [address]    Report Status 12/10/2020 FINAL  Final  Culture, blood (routine x 2)     Status: None   Collection Time: 12/05/20  4:25 AM   Specimen: BLOOD  Result Value Ref Range Status   Specimen Description BLOOD LEFT FOREARM  Final   Special Requests   Final    BOTTLES DRAWN AEROBIC AND ANAEROBIC Blood Culture results may not be optimal due to an excessive volume of blood received in culture bottles   Culture   Final    NO GROWTH 5 DAYS Performed at [facility], [address] [place], [place] [address]    Report Status 12/10/2020 FINAL  Final  Surgical PCR screen     Status: None   Collection Time: 12/05/20  5:25 AM   Specimen: Nasal Mucosa; Nasal Swab  Result Value Ref Range Status   MRSA, PCR NEGATIVE NEGATIVE Final   Staphylococcus aureus NEGATIVE NEGATIVE Final    Comment: (NOTE) The Xpert SA Assay (FDA approved for NASAL specimens in patients 35 years of age and older), is one component of a comprehensive surveillance  program. It is not intended to diagnose infection nor to guide or monitor treatment. Performed at [facility], [address], [place], [place] [address]   Resp Panel by RT-PCR (Flu A&B, Covid) Nasopharyngeal Swab     Status: None   Collection Time: 12/06/20  8:13 AM   Specimen: Nasopharyngeal Swab; Nasopharyngeal(NP) swabs in vial transport medium  Result Value Ref Range Status   SARS Coronavirus 2 by RT PCR NEGATIVE NEGATIVE Final    Comment: (NOTE) SARS-CoV-2 target nucleic acids are NOT DETECTED.  The SARS-CoV-2 RNA is generally detectable in upper respiratory specimens during the acute phase of infection. The lowest concentration of SARS-CoV-2 viral copies this assay can detect is 138 copies/mL. A negative result does not preclude SARS-Cov-2 infection and should not be used as the sole basis for treatment or other patient management decisions. A negative result may occur with  improper specimen collection/handling, submission of specimen other than nasopharyngeal swab, presence of viral mutation(s) within the areas targeted by this assay, and inadequate number of viral copies(<138 copies/mL). A negative result must be combined with clinical observations, patient history, and epidemiological information. The expected result is Negative.  Fact Sheet for Patients:  BloggerCourse.com  Fact Sheet for Healthcare  Providers:  SeriousBroker.it  This test is no t yet approved or cleared by the [place] and  has been authorized for detection and/or diagnosis of SARS-CoV-2 by FDA under an Emergency Use Authorization (EUA). This EUA will remain  in effect (meaning this test can be used) for the duration of the COVID-19 declaration under Section 564(b)(1) of the Act, 21 U.S.C.section 360bbb-3(b)(1), unless the authorization is terminated  or revoked sooner.       Influenza A by PCR NEGATIVE NEGATIVE Final   Influenza B  by PCR NEGATIVE NEGATIVE Final    Comment: (NOTE) The Xpert Xpress SARS-CoV-2/FLU/RSV plus assay is intended as an aid in the diagnosis of influenza from Nasopharyngeal swab specimens and should not be used as a sole basis for treatment. Nasal washings and aspirates are unacceptable for Xpert Xpress SARS-CoV-2/FLU/RSV testing.  Fact Sheet for Patients: BloggerCourse.com  Fact Sheet for Healthcare Providers: SeriousBroker.it  This test is not yet approved or cleared by the [place] FDA and has been authorized for detection and/or diagnosis of SARS-CoV-2 by FDA under an Emergency Use Authorization (EUA). This EUA will remain in effect (meaning this test can be used) for the duration of the COVID-19 declaration under Section 564(b)(1) of the Act, 21 U.S.C. section 360bbb-3(b)(1), unless the authorization is terminated or revoked.  Performed at [facility], [address] [place], [place] [address]   Aerobic/Anaerobic Culture (surgical/deep wound)     Status: None (Preliminary result)   Collection Time: 12/09/20 10:04 AM   Specimen: Bone; Tissue  Result Value Ref Range Status   Specimen Description BONE  Final   Special Requests NONE  Final   Gram Stain   Final    NO WBC SEEN NO ORGANISMS SEEN Performed at [facility], [address], [place], [place] [address]    Culture   Final    RARE CANDIDA ALBICANS RARE ESCHERICHIA COLI NO ANAEROBES ISOLATED; CULTURE IN PROGRESS FOR 5 DAYS    Report Status PENDING  Incomplete   Organism ID, Bacteria ESCHERICHIA COLI  Final      Susceptibility   Escherichia coli - MIC*    AMPICILLIN >=32 RESISTANT Resistant     CEFAZOLIN <=4 SENSITIVE Sensitive     CEFEPIME <=0.12 SENSITIVE Sensitive     CEFTAZIDIME <=1 SENSITIVE Sensitive     CEFTRIAXONE <=0.25 SENSITIVE Sensitive     CIPROFLOXACIN 0.5 SENSITIVE Sensitive     GENTAMICIN <=1 SENSITIVE Sensitive     IMIPENEM  <=0.25 SENSITIVE Sensitive     TRIMETH/SULFA >=320 RESISTANT Resistant     AMPICILLIN/SULBACTAM >=32 RESISTANT Resistant     PIP/TAZO <=4 SENSITIVE Sensitive     * RARE ESCHERICHIA COLI  Aerobic/Anaerobic Culture (surgical/deep wound)     Status: None (Preliminary result)   Collection Time: 12/09/20 10:06 AM   Specimen: Foot, Right; Wound  Result Value Ref Range Status   Specimen Description FOOT  Final   Special Requests RIGHT  Final   Gram Stain   Final    NO WBC SEEN NO ORGANISMS SEEN Performed at [facility], [address], [place], [place] [address]    Culture   Final    FEW CANDIDA ALBICANS RARE GRAM NEGATIVE RODS IDENTIFICATION AND SUSCEPTIBILITIES TO FOLLOW NO ANAEROBES ISOLATED; CULTURE IN PROGRESS FOR 5 DAYS    Report Status PENDING  Incomplete    Assessment & Plan:  Patient was evaluated and treated and all questions answered.  Osteomyelitis R Foot s/p  Partial 4th/5th ray amputation, 3rd metatarsal resection -Continue IV Abx. -Discussed OR vs continued antimicrobial therapy. I did agree that at minimum debridement is warranted. Will plan for RTOR tomorrow for repeat debridement given growth of yeast on cultures of excised metatarsal bone. Intra-operatively the resection margin did appear hard and viable, will obtain new culture to eval for continued growth. [facility] dressing to be left intact until surgery. -NPO midnight -Heel WBAT RLE -CAM boot right -PT/OT  Park Liter, DPM  Best accessible via secure chat for questions or concerns.

## 2020-12-12 NOTE — Plan of Care (Signed)
  Problem: Pain Managment: Goal: General experience of comfort will improve Outcome: Progressing   Problem: Safety: Goal: Ability to remain free from injury will improve Outcome: Progressing   Problem: Skin Integrity: Goal: Risk for impaired skin integrity will decrease Outcome: Progressing   

## 2020-12-12 NOTE — Plan of Care (Signed)

## 2020-12-12 NOTE — Progress Notes (Signed)
Additional diabetic education given with patient regarding questions about his blood glucose readings and insulin coverage orders.  The patient reports understanding.

## 2020-12-12 NOTE — Plan of Care (Signed)

## 2020-12-13 ENCOUNTER — Inpatient Hospital Stay (HOSPITAL_COMMUNITY): Payer: Commercial Managed Care - PPO | Admitting: Anesthesiology

## 2020-12-13 ENCOUNTER — Encounter (HOSPITAL_COMMUNITY)
Admission: AD | Disposition: A | Discharge status: Home Health | Payer: Self-pay | Source: Ambulatory Visit | Attending: Student

## 2020-12-13 ENCOUNTER — Inpatient Hospital Stay (HOSPITAL_COMMUNITY): Payer: Commercial Managed Care - PPO

## 2020-12-13 ENCOUNTER — Encounter (HOSPITAL_COMMUNITY): Payer: Self-pay | Admitting: Internal Medicine

## 2020-12-13 HISTORY — PX: I & D EXTREMITY: SHX5045

## 2020-12-13 HISTORY — PX: BONE BIOPSY: SHX375

## 2020-12-13 LAB — COMPREHENSIVE METABOLIC PANEL
ALT: 42 U/L (ref 0–44)
AST: 30 U/L (ref 15–41)
Albumin: 2.6 g/dL — ABNORMAL LOW (ref 3.5–5.0)
Alkaline Phosphatase: 68 U/L (ref 38–126)
Anion gap: 8 (ref 5–15)
BUN: 23 mg/dL — ABNORMAL HIGH (ref 6–20)
CO2: 25 mmol/L (ref 22–32)
Calcium: 8.9 mg/dL (ref 8.9–10.3)
Chloride: 103 mmol/L (ref 98–111)
Creatinine, Ser: 1.05 mg/dL (ref 0.61–1.24)
GFR, Estimated: >60 mL/min (ref >60)
Glucose, Bld: 160 mg/dL — ABNORMAL HIGH (ref 70–99)
Potassium: 3.9 mmol/L (ref 3.5–5.1)
Sodium: 136 mmol/L (ref 135–145)
Total Bilirubin: 0.5 mg/dL (ref 0.3–1.2)
Total Protein: 6.6 g/dL (ref 6.5–8.1)

## 2020-12-13 LAB — CBC
HCT: 25.4 % — ABNORMAL LOW (ref 39.0–52.0)
Hemoglobin: 8.3 g/dL — ABNORMAL LOW (ref 13.0–17.0)
MCH: 24.3 pg — ABNORMAL LOW (ref 26.0–34.0)
MCHC: 32.7 g/dL (ref 30.0–36.0)
MCV: 74.5 fL — ABNORMAL LOW (ref 80.0–100.0)
Platelets: 311 10*3/uL (ref 150–400)
RBC: 3.41 MIL/uL — ABNORMAL LOW (ref 4.22–5.81)
RDW: 13.8 % (ref 11.5–15.5)
WBC: 9.1 10*3/uL (ref 4.0–10.5)
nRBC: 0 % (ref 0.0–0.2)

## 2020-12-13 LAB — GLUCOSE, CAPILLARY
Glucose-Capillary: 177 mg/dL — ABNORMAL HIGH (ref 70–99)
Glucose-Capillary: 85 mg/dL (ref 70–99)
Glucose-Capillary: 93 mg/dL (ref 70–99)
Glucose-Capillary: 194 mg/dL — ABNORMAL HIGH (ref 70–99)

## 2020-12-13 SURGERY — IRRIGATION AND DEBRIDEMENT EXTREMITY
Anesthesia: Monitor Anesthesia Care | Site: Foot | Laterality: Right

## 2020-12-13 MED ORDER — PROPOFOL 10 MG/ML IV BOLUS
INTRAVENOUS | Status: DC | PRN
  Administered 2020-12-13 (×2): 20 mg via INTRAVENOUS

## 2020-12-13 MED ORDER — CHLORHEXIDINE GLUCONATE 0.12 % MT SOLN
OROMUCOSAL | Status: AC
  Administered 2020-12-13: 14:00:00 15 mL via OROMUCOSAL
  Filled 2020-12-13: qty 15

## 2020-12-13 MED ORDER — BUPIVACAINE HCL (PF) 0.5 % IJ SOLN
INTRAMUSCULAR | Status: AC
  Filled 2020-12-13: qty 30

## 2020-12-13 MED ORDER — PROPOFOL 10 MG/ML IV BOLUS
INTRAVENOUS | Status: AC
  Filled 2020-12-13: qty 20

## 2020-12-13 MED ORDER — CHLORHEXIDINE GLUCONATE 0.12 % MT SOLN: 15.0000 mL | Freq: Once | OROMUCOSAL | Status: AC

## 2020-12-13 MED ORDER — FENTANYL CITRATE (PF) 250 MCG/5ML IJ SOLN
INTRAMUSCULAR | Status: AC
  Filled 2020-12-13: qty 5

## 2020-12-13 MED ORDER — ORAL CARE MOUTH RINSE: 15.0000 mL | Freq: Once | OROMUCOSAL | Status: AC

## 2020-12-13 MED ORDER — SODIUM CHLORIDE 0.9 % IR SOLN
Status: DC | PRN
Start: 2020-12-13 — End: 2020-12-13
  Administered 2020-12-13: 1000 mL

## 2020-12-13 MED ORDER — LACTATED RINGERS IV SOLN: INTRAVENOUS | Status: DC

## 2020-12-13 MED ORDER — BUPIVACAINE HCL (PF) 0.5 % IJ SOLN
INTRAMUSCULAR | Status: DC | PRN
  Administered 2020-12-13: 10 mL

## 2020-12-13 MED ORDER — SODIUM CHLORIDE 0.9 % IV SOLN
125.0000 mg | Freq: Once | INTRAVENOUS | Status: AC
  Administered 2020-12-13: 23:00:00 125 mg via INTRAVENOUS
  Filled 2020-12-13 (×2): qty 10

## 2020-12-13 MED ORDER — FENTANYL CITRATE (PF) 100 MCG/2ML IJ SOLN
INTRAMUSCULAR | Status: DC | PRN
  Administered 2020-12-13: 50 ug via INTRAVENOUS

## 2020-12-13 MED ORDER — PROPOFOL 500 MG/50ML IV EMUL
INTRAVENOUS | Status: DC | PRN
  Administered 2020-12-13: 50 ug/kg/min via INTRAVENOUS

## 2020-12-13 MED ORDER — MIDAZOLAM HCL 5 MG/5ML IJ SOLN
INTRAMUSCULAR | Status: DC | PRN
  Administered 2020-12-13 (×2): 1 mg via INTRAVENOUS

## 2020-12-13 MED ORDER — MIDAZOLAM HCL 2 MG/2ML IJ SOLN
INTRAMUSCULAR | Status: AC
  Filled 2020-12-13: qty 2

## 2020-12-13 MED ORDER — VANCOMYCIN HCL 1000 MG IV SOLR
INTRAVENOUS | Status: DC | PRN
  Administered 2020-12-13: 1000 mg via TOPICAL

## 2020-12-13 MED ORDER — LIDOCAINE 2% (20 MG/ML) 5 ML SYRINGE
INTRAMUSCULAR | Status: DC | PRN
  Administered 2020-12-13: 60 mg via INTRAVENOUS

## 2020-12-13 MED ORDER — VANCOMYCIN HCL 1000 MG IV SOLR
INTRAVENOUS | Status: AC
  Filled 2020-12-13: qty 1000

## 2020-12-13 MED ORDER — CEFAZOLIN SODIUM-DEXTROSE 2-4 GM/100ML-% IV SOLN
2.0000 g | Freq: Three times a day (TID) | INTRAVENOUS | Status: DC
  Administered 2020-12-13 – 2020-12-15 (×7): 2 g via INTRAVENOUS
  Filled 2020-12-13 (×9): qty 100

## 2020-12-13 MED ORDER — FLUCONAZOLE 200 MG PO TABS
400.0000 mg | ORAL_TABLET | Freq: Every day | ORAL | Status: DC
  Administered 2020-12-14 – 2020-12-15 (×2): 400 mg via ORAL
  Filled 2020-12-13 (×3): qty 2

## 2020-12-13 SURGICAL SUPPLY — 52 items
BLADE AVERAGE 25MMX9MM (BLADE) ×1
BLADE AVERAGE 25X9 (BLADE) ×2 IMPLANT
BLADE OSCILLATING/SAGITTAL (BLADE) ×2
BLADE SW THK.38XMED NAR THN (BLADE) ×1 IMPLANT
BNDG ELASTIC 4X5.8 VLCR STR LF (GAUZE/BANDAGES/DRESSINGS) ×3 IMPLANT
BNDG ESMARK 4X9 LF (GAUZE/BANDAGES/DRESSINGS) IMPLANT
BNDG GAUZE ELAST 4 BULKY (GAUZE/BANDAGES/DRESSINGS) IMPLANT
CANISTER WOUND CARE 500ML ATS (WOUND CARE) ×3 IMPLANT
CHLORAPREP W/TINT 26 (MISCELLANEOUS) ×3 IMPLANT
COVER SURGICAL LIGHT HANDLE (MISCELLANEOUS) ×3 IMPLANT
COVER WAND RF STERILE (DRAPES) ×3 IMPLANT
CUFF TOURN SGL QUICK 18X4 (TOURNIQUET CUFF) IMPLANT
CUFF TOURN SGL QUICK 34 (TOURNIQUET CUFF)
CUFF TRNQT CYL 34X4.125X (TOURNIQUET CUFF) IMPLANT
DRAPE U-SHAPE 47X51 STRL (DRAPES) ×3 IMPLANT
DRSG MEPITEL 4X7.2 (GAUZE/BANDAGES/DRESSINGS) ×3 IMPLANT
DRSG VAC ATS LRG SENSATRAC (GAUZE/BANDAGES/DRESSINGS) ×3 IMPLANT
ELECT CAUTERY BLADE 6.4 (BLADE) ×3 IMPLANT
ELECT REM PT RETURN 9FT ADLT (ELECTROSURGICAL) ×3
ELECTRODE REM PT RTRN 9FT ADLT (ELECTROSURGICAL) ×1 IMPLANT
GAUZE SPONGE 4X4 12PLY STRL (GAUZE/BANDAGES/DRESSINGS) IMPLANT
GLOVE BIO SURGEON STRL SZ7.5 (GLOVE) ×3 IMPLANT
GLOVE BIOGEL PI IND STRL 8 (GLOVE) ×1 IMPLANT
GLOVE BIOGEL PI INDICATOR 8 (GLOVE) ×2
GOWN STRL REUS W/ TWL LRG LVL3 (GOWN DISPOSABLE) ×1 IMPLANT
GOWN STRL REUS W/ TWL XL LVL3 (GOWN DISPOSABLE) ×1 IMPLANT
GOWN STRL REUS W/TWL LRG LVL3 (GOWN DISPOSABLE) ×2
GOWN STRL REUS W/TWL XL LVL3 (GOWN DISPOSABLE) ×2
KIT BASIN OR (CUSTOM PROCEDURE TRAY) ×3 IMPLANT
KIT TURNOVER KIT B (KITS) ×3 IMPLANT
MANIFOLD NEPTUNE II (INSTRUMENTS) ×3 IMPLANT
MICROMATRIX 1000MG (Tissue) ×3 IMPLANT
NEEDLE BIOPSY JAMSHIDI 8X6 (NEEDLE) IMPLANT
NEEDLE HYPO 25GX1X1/2 BEV (NEEDLE) IMPLANT
NS IRRIG 1000ML POUR BTL (IV SOLUTION) ×3 IMPLANT
PACK ORTHO EXTREMITY (CUSTOM PROCEDURE TRAY) ×3 IMPLANT
PAD ARMBOARD 7.5X6 YLW CONV (MISCELLANEOUS) ×6 IMPLANT
PROBE DEBRIDE SONICVAC MISONIX (TIP) IMPLANT
SET CYSTO W/LG BORE CLAMP LF (SET/KITS/TRAYS/PACK) ×3 IMPLANT
SOL PREP POV-IOD 4OZ 10% (MISCELLANEOUS) ×6 IMPLANT
SOLUTION PARTIC MCRMTRX 1000MG (Tissue) ×1 IMPLANT
STAPLER VISISTAT 35W (STAPLE) ×3 IMPLANT
SUT ETHILON 3 0 PS 1 (SUTURE) ×6 IMPLANT
SUT ETHILON 4 0 PS 2 18 (SUTURE) ×3 IMPLANT
SUT VIC AB 3-0 SH 27 (SUTURE) ×2
SUT VIC AB 3-0 SH 27X BRD (SUTURE) ×1 IMPLANT
SYR CONTROL 10ML LL (SYRINGE) IMPLANT
TOWEL GREEN STERILE (TOWEL DISPOSABLE) ×3 IMPLANT
TOWEL GREEN STERILE FF (TOWEL DISPOSABLE) ×3 IMPLANT
TUBE CONNECTING 12'X1/4 (SUCTIONS) ×1
TUBE CONNECTING 12X1/4 (SUCTIONS) ×2 IMPLANT
YANKAUER SUCT BULB TIP NO VENT (SUCTIONS) ×3 IMPLANT

## 2020-12-13 NOTE — Plan of Care (Signed)

## 2020-12-13 NOTE — Progress Notes (Signed)
Post op xray done to the right foot complete

## 2020-12-13 NOTE — Progress Notes (Signed)
Subjective:  Patient ID: Benjamin Russo, male    DOB: Nov 14, 1976,  MRN: [id number]  Patient seen in pre-op. Understands plan for OR today. Objective:   Vitals:   12/13/20 0811 12/13/20 1005  BP: 138/78 138/78  Pulse: 68 68  Resp: 17   Temp: 98.1 F (36.7 C)   SpO2: 99%    Dressing intact RLE. Minimal drainage in cannister. Good seal noted.  Results for orders placed or performed during the hospital encounter of 12/04/20  Culture, blood (routine x 2)     Status: None   Collection Time: 12/05/20  4:14 AM   Specimen: BLOOD  Result Value Ref Range Status   Specimen Description BLOOD RIGHT HAND  Final   Special Requests   Final    BOTTLES DRAWN AEROBIC AND ANAEROBIC Blood Culture results may not be optimal due to an excessive volume of blood received in culture bottles   Culture   Final    NO GROWTH 5 DAYS Performed at [facility], [address], [place], [place] [address]    Report Status 12/10/2020 FINAL  Final  Culture, blood (routine x 2)     Status: None   Collection Time: 12/05/20  4:25 AM   Specimen: BLOOD  Result Value Ref Range Status   Specimen Description BLOOD LEFT FOREARM  Final   Special Requests   Final    BOTTLES DRAWN AEROBIC AND ANAEROBIC Blood Culture results may not be optimal due to an excessive volume of blood received in culture bottles   Culture   Final    NO GROWTH 5 DAYS Performed at [facility], [address] [place], [place] [address]    Report Status 12/10/2020 FINAL  Final  Surgical PCR screen     Status: None   Collection Time: 12/05/20  5:25 AM   Specimen: Nasal Mucosa; Nasal Swab  Result Value Ref Range Status   MRSA, PCR NEGATIVE NEGATIVE Final   Staphylococcus aureus NEGATIVE NEGATIVE Final    Comment: (NOTE) The Xpert SA Assay (FDA approved for NASAL specimens in patients 30 years of age and older), is one component of a comprehensive surveillance program. It is not intended to diagnose infection nor to guide or  monitor treatment. Performed at [facility], [address], [place], [place] [address]   Resp Panel by RT-PCR (Flu A&B, Covid) Nasopharyngeal Swab     Status: None   Collection Time: 12/06/20  8:13 AM   Specimen: Nasopharyngeal Swab; Nasopharyngeal(NP) swabs in vial transport medium  Result Value Ref Range Status   SARS Coronavirus 2 by RT PCR NEGATIVE NEGATIVE Final    Comment: (NOTE) SARS-CoV-2 target nucleic acids are NOT DETECTED.  The SARS-CoV-2 RNA is generally detectable in upper respiratory specimens during the acute phase of infection. The lowest concentration of SARS-CoV-2 viral copies this assay can detect is 138 copies/mL. A negative result does not preclude SARS-Cov-2 infection and should not be used as the sole basis for treatment or other patient management decisions. A negative result may occur with  improper specimen collection/handling, submission of specimen other than nasopharyngeal swab, presence of viral mutation(s) within the areas targeted by this assay, and inadequate number of viral copies(<138 copies/mL). A negative result must be combined with clinical observations, patient history, and epidemiological information. The expected result is Negative.  Fact Sheet for Patients:  BloggerCourse.com  Fact Sheet for Healthcare Providers:  SeriousBroker.it  This test is no t yet approved or cleared by  the [employer] and  has been authorized for detection and/or diagnosis of SARS-CoV-2 by FDA under an Emergency Use Authorization (EUA). This EUA will remain  in effect (meaning this test can be used) for the duration of the COVID-19 declaration under Section 564(b)(1) of the Act, 21 U.S.C.section 360bbb-3(b)(1), unless the authorization is terminated  or revoked sooner.       Influenza A by PCR NEGATIVE NEGATIVE Final   Influenza B by PCR NEGATIVE NEGATIVE Final    Comment: (NOTE) The Xpert  Xpress SARS-CoV-2/FLU/RSV plus assay is intended as an aid in the diagnosis of influenza from Nasopharyngeal swab specimens and should not be used as a sole basis for treatment. Nasal washings and aspirates are unacceptable for Xpert Xpress SARS-CoV-2/FLU/RSV testing.  Fact Sheet for Patients: BloggerCourse.com  Fact Sheet for Healthcare Providers: SeriousBroker.it  This test is not yet approved or cleared by the [place] FDA and has been authorized for detection and/or diagnosis of SARS-CoV-2 by FDA under an Emergency Use Authorization (EUA). This EUA will remain in effect (meaning this test can be used) for the duration of the COVID-19 declaration under Section 564(b)(1) of the Act, 21 U.S.C. section 360bbb-3(b)(1), unless the authorization is terminated or revoked.  Performed at [facility], [address], [place], [place] [address]   Aerobic/Anaerobic Culture (surgical/deep wound)     Status: None (Preliminary result)   Collection Time: 12/09/20 10:04 AM   Specimen: Bone; Tissue  Result Value Ref Range Status   Specimen Description BONE  Final   Special Requests NONE  Final   Gram Stain   Final    NO WBC SEEN NO ORGANISMS SEEN Performed at [facility], [address] [place], [place] [address]    Culture   Final    RARE CANDIDA ALBICANS RARE ESCHERICHIA COLI NO ANAEROBES ISOLATED; CULTURE IN PROGRESS FOR 5 DAYS    Report Status PENDING  Incomplete   Organism ID, Bacteria ESCHERICHIA COLI  Final      Susceptibility   Escherichia coli - MIC*    AMPICILLIN >=32 RESISTANT Resistant     CEFAZOLIN <=4 SENSITIVE Sensitive     CEFEPIME <=0.12 SENSITIVE Sensitive     CEFTAZIDIME <=1 SENSITIVE Sensitive     CEFTRIAXONE <=0.25 SENSITIVE Sensitive     CIPROFLOXACIN 0.5 SENSITIVE Sensitive     GENTAMICIN <=1 SENSITIVE Sensitive     IMIPENEM <=0.25 SENSITIVE Sensitive     TRIMETH/SULFA >=320 RESISTANT  Resistant     AMPICILLIN/SULBACTAM >=32 RESISTANT Resistant     PIP/TAZO <=4 SENSITIVE Sensitive     * RARE ESCHERICHIA COLI  Aerobic/Anaerobic Culture (surgical/deep wound)     Status: None (Preliminary result)   Collection Time: 12/09/20 10:06 AM   Specimen: Foot, Right; Wound  Result Value Ref Range Status   Specimen Description FOOT  Final   Special Requests RIGHT  Final   Gram Stain   Final    NO WBC SEEN NO ORGANISMS SEEN Performed at [facility], [address] [place], [place] [address]    Culture   Final    FEW CANDIDA ALBICANS RARE ESCHERICHIA COLI NO ANAEROBES ISOLATED; CULTURE IN PROGRESS FOR 5 DAYS    Report Status PENDING  Incomplete   Organism ID, Bacteria ESCHERICHIA COLI  Final      Susceptibility   Escherichia coli - MIC*    AMPICILLIN >=32 RESISTANT Resistant     CEFAZOLIN <=4 SENSITIVE Sensitive     CEFEPIME <=  0.12 SENSITIVE Sensitive     CEFTAZIDIME <=1 SENSITIVE Sensitive     CEFTRIAXONE <=0.25 SENSITIVE Sensitive     CIPROFLOXACIN 0.5 SENSITIVE Sensitive     GENTAMICIN <=1 SENSITIVE Sensitive     IMIPENEM <=0.25 SENSITIVE Sensitive     TRIMETH/SULFA >=320 RESISTANT Resistant     AMPICILLIN/SULBACTAM >=32 RESISTANT Resistant     PIP/TAZO <=4 SENSITIVE Sensitive     * RARE ESCHERICHIA COLI    Assessment & Plan:  Patient was evaluated and treated and all questions answered.  Osteomyelitis R Foot s/p Partial 4th/5th ray amputation, 3rd metatarsal resection -Continue IV Abx. -OR today for repeat debridement and bone biopsy. [facility] dressing to be left intact until surgery. -NPO midnight -Heel WBAT RLE -CAM boot right -PT/OT  Park Liter, DPM  Best accessible via secure chat for questions or concerns.

## 2020-12-13 NOTE — Op Note (Signed)
  Patient Name: Benjamin Russo DOB: 1976/12/21  MRN: [id number]   Date of Surgery: 12/04/2020 - 12/09/2020  Surgeon: Dr. Hardie Pulley, DPM Assistants: none  Pre-operative Diagnosis:  Osteomyelitis Post-operative Diagnosis:  Same Procedures:  1) Incision and drainage of right foot for osteomyelitis  2) Application of wound VAC Pathology/Specimens: ID Type Source Tests Collected by Time Destination  A : Right 3rd Metatarsal predebridement  Tissue PATH Other AEROBIC/ANAEROBIC CULTURE (SURGICAL/DEEP WOUND) Evelina Bucy, DPM 12/13/2020 1501   B : Right 3rd Metatarsal postdebridement  Tissue PATH Other AEROBIC/ANAEROBIC CULTURE (SURGICAL/DEEP WOUND) Evelina Bucy, DPM 12/13/2020 1507    Anesthesia: MAC Hemostasis: * No tourniquets in log * Estimated Blood Loss: 5 mL  materials:  Implant Name Type Inv. Item Serial No. Manufacturer Lot No. LRB No. Used Action  MICROMATRIX 1000MG - [id number] Tissue MI[employer]OMATRIX 1000MG [id number] Right 1 Implanted   Medications: 1g Vancomycin, 10 ml 0.5% marcaine plain Complications: none  Indications for Procedure:  This is a 44 y.o. male with known osteomyelitis of the right foot.  He previously underwent partial fifth ray resection, partial fourth ray resection, third metatarsal head and proximal phalangeal base resection.  Postoperative cultures of the third metatarsal yeast and bacterial.  Given difficulty in treating osteomyelitis due to yeast, per infectious disease recommendations repeat debridement was warranted. Of note this procedure was more extensive resection of the third metatarsal.   Procedure in Detail: Patient was identified in pre-operative holding area. Formal consent was signed and the right lower extremity was marked. Patient was brought back to the operating room. Anesthesia was induced. The extremity was prepped and draped in the usual sterile fashion. Timeout was taken to confirm patient name, laterality, and  procedure prior to incision.   Attention was then directed to the right foot.  The previous incision was partially reopened so that the metatarsal resection area was encountered.  A sagittal saw was used to cut a portion of the third metatarsal as a predebridement bone biopsy sample. The bone did appear firm healthy and viable.  The wound was sharply debrided with a 15 blade and rongeur.  Wound was then irrigated with 3 L of normal saline via pulse.  Gloves were changed and a new sawblade was used to excise a portion of the metatarsal as a post-debridement bone sample.  Again bone appeared healthy firm and viable.  The wound was packed with micro matrix powder as well as clindamycin powder.  The wound was then closed in layers with 3-0 Vicryl, 3-0 nylon and skin staples.  Incisional wound VAC was fashioned with a black sponge, nonadherent dressing.  Set to suction at 125 with good seal noted  The foot was then dressed with Ace bandage. Patient tolerated the procedure well.  Disposition: Following a period of post-operative monitoring, patient will be transferred back to the floor.  Upon further protuberance we will potentially adjust antibiotics and coordinate as necessary.

## 2020-12-13 NOTE — Progress Notes (Signed)
Report called to Darl Pikes, RN in Short Stay

## 2020-12-13 NOTE — Progress Notes (Signed)
[facility]  Date of Admission:  12/04/2020     Total days of antibiotics 10         ASSESSMENT:  [name] cultures have confirmed candida albicans and E. Coli. Will change antifungal to fluconazole and cefepime to cefazolin. Duration of antibiotic therapy to be determined by outcomes of surgery today.   PLAN:  1. Change antifungal to fluconazole and antibiotic to cefazolin. 2. Await surgical outcomes to determine duration of treatment.   Principal Problem:   Acute osteomyelitis of toe, right (HCC) Active Problems:   Gangrene (HCC)   Hypertension   Type 2 diabetes mellitus (HCC)   Cellulitis of right foot   Ulcer of right foot, with necrosis of bone (HCC)   Post-op pain   Post-operative state   . enoxaparin (LOVENOX) injection  40 mg Subcutaneous Q24H  . fluconazole  400 mg Oral Daily  .  HYDROmorphone (DILAUDID) injection  2 mg Intravenous Once  . insulin aspart  0-15 Units Subcutaneous TID WC  . insulin aspart  0-5 Units Subcutaneous QHS  . insulin aspart  3 Units Subcutaneous TID WC  . insulin glargine  10 Units Subcutaneous BID  . metoprolol succinate  25 mg Oral Daily    SUBJECTIVE:  Afebrile overnight with no acute events. Awaiting I&D with Dr. Samuella Russo today.   No Known Allergies   Review of Systems: Review of Systems  Constitutional: Negative for chills, fever and weight loss.  Respiratory: Negative for cough, shortness of breath and wheezing.   Cardiovascular: Negative for chest pain and leg swelling.  Gastrointestinal: Negative for abdominal pain, constipation, diarrhea, nausea and vomiting.  Skin: Negative for rash.      OBJECTIVE: Vitals:   12/12/20 1947 12/13/20 0353 12/13/20 0811 12/13/20 1005  BP: 130/80 129/71 138/78 138/78  Pulse: 77 70 68 68  Resp: 18 16 17    Temp: 98.3 F (36.8 C) 98.4 F (36.9 C) 98.1 F (36.7 C)   TempSrc:   Oral   SpO2: 99% 100% 99%   Weight:      Height:       Body mass index  is 27.62 kg/m.  Physical Exam Constitutional:      General: He is not in acute distress.    Appearance: He is well-developed and well-nourished.     Comments: Lying in bed with head of bed elevated; pleasant.   Cardiovascular:     Rate and Rhythm: Normal rate and regular rhythm.     Pulses: Intact distal pulses.     Heart sounds: Normal heart sounds.  Pulmonary:     Effort: Pulmonary effort is normal.     Breath sounds: Normal breath sounds.  Musculoskeletal:     Comments: Surgical dressing on right foot.   Skin:    General: Skin is warm and dry.  Neurological:     Mental Status: He is alert and oriented to person, place, and time.  Psychiatric:        Mood and Affect: Mood and affect normal.        Behavior: Behavior normal.        Thought Content: Thought content normal.        Judgment: Judgment normal.     Lab Results Lab Results  Component Value Date   WBC 9.1 12/13/2020   HGB 8.3 (L) 12/13/2020   HCT 25.4 (L) 12/13/2020   MCV 74.5 (L) 12/13/2020   PLT 311 12/13/2020    Lab Results  Component Value Date   CREATININE 1.05 12/13/2020   BUN 23 (H) 12/13/2020   NA 136 12/13/2020   K 3.9 12/13/2020   CL 103 12/13/2020   CO2 25 12/13/2020    Lab Results  Component Value Date   ALT 42 12/13/2020   AST 30 12/13/2020   ALKPHOS 68 12/13/2020   BILITOT 0.5 12/13/2020     Microbiology: Recent Results (from the past 240 hour(s))  Culture, blood (routine x 2)     Status: None   Collection Time: 12/05/20  4:14 AM   Specimen: BLOOD  Result Value Ref Range Status   Specimen Description BLOOD RIGHT HAND  Final   Special Requests   Final    BOTTLES DRAWN AEROBIC AND ANAEROBIC Blood Culture results may not be optimal due to an excessive volume of blood received in culture bottles   Culture   Final    NO GROWTH 5 DAYS Performed at [facility], [address], [place], [place] [address]    Report Status 12/10/2020 FINAL  Final  Culture, blood (routine x 2)      Status: None   Collection Time: 12/05/20  4:25 AM   Specimen: BLOOD  Result Value Ref Range Status   Specimen Description BLOOD LEFT FOREARM  Final   Special Requests   Final    BOTTLES DRAWN AEROBIC AND ANAEROBIC Blood Culture results may not be optimal due to an excessive volume of blood received in culture bottles   Culture   Final    NO GROWTH 5 DAYS Performed at [facility], [address] [place], [place] [address]    Report Status 12/10/2020 FINAL  Final  Surgical PCR screen     Status: None   Collection Time: 12/05/20  5:25 AM   Specimen: Nasal Mucosa; Nasal Swab  Result Value Ref Range Status   MRSA, PCR NEGATIVE NEGATIVE Final   Staphylococcus aureus NEGATIVE NEGATIVE Final    Comment: (NOTE) The Xpert SA Assay (FDA approved for NASAL specimens in patients 50 years of age and older), is one component of a comprehensive surveillance program. It is not intended to diagnose infection nor to guide or monitor treatment. Performed at [facility], [address] [place], [place] [address]   Resp Panel by RT-PCR (Flu A&B, Covid) Nasopharyngeal Swab     Status: None   Collection Time: 12/06/20  8:13 AM   Specimen: Nasopharyngeal Swab; Nasopharyngeal(NP) swabs in vial transport medium  Result Value Ref Range Status   SARS Coronavirus 2 by RT PCR NEGATIVE NEGATIVE Final    Comment: (NOTE) SARS-CoV-2 target nucleic acids are NOT DETECTED.  The SARS-CoV-2 RNA is generally detectable in upper respiratory specimens during the acute phase of infection. The lowest concentration of SARS-CoV-2 viral copies this assay can detect is 138 copies/mL. A negative result does not preclude SARS-Cov-2 infection and should not be used as the sole basis for treatment or other patient management decisions. A negative result may occur with  improper specimen collection/handling, submission of specimen other than nasopharyngeal swab, presence of viral mutation(s) within the areas  targeted by this assay, and inadequate number of viral copies(<138 copies/mL). A negative result must be combined with clinical observations, patient history, and epidemiological information. The expected result is Negative.  Fact Sheet for Patients:  BloggerCourse.com  Fact Sheet for Healthcare Providers:  SeriousBroker.it  This test is no t yet approved or cleared by the [place] FDA and  has been  authorized for detection and/or diagnosis of SARS-CoV-2 by FDA under an Emergency Use Authorization (EUA). This EUA will remain  in effect (meaning this test can be used) for the duration of the COVID-19 declaration under Section 564(b)(1) of the Act, 21 U.S.C.section 360bbb-3(b)(1), unless the authorization is terminated  or revoked sooner.       Influenza A by PCR NEGATIVE NEGATIVE Final   Influenza B by PCR NEGATIVE NEGATIVE Final    Comment: (NOTE) The Xpert Xpress SARS-CoV-2/FLU/RSV plus assay is intended as an aid in the diagnosis of influenza from Nasopharyngeal swab specimens and should not be used as a sole basis for treatment. Nasal washings and aspirates are unacceptable for Xpert Xpress SARS-CoV-2/FLU/RSV testing.  Fact Sheet for Patients: BloggerCourse.com  Fact Sheet for Healthcare Providers: SeriousBroker.it  This test is not yet approved or cleared by the [place] FDA and has been authorized for detection and/or diagnosis of SARS-CoV-2 by FDA under an Emergency Use Authorization (EUA). This EUA will remain in effect (meaning this test can be used) for the duration of the COVID-19 declaration under Section 564(b)(1) of the Act, 21 U.S.C. section 360bbb-3(b)(1), unless the authorization is terminated or revoked.  Performed at [facility], [address], [place], [place] [address]   Aerobic/Anaerobic Culture (surgical/deep wound)     Status: None  (Preliminary result)   Collection Time: 12/09/20 10:04 AM   Specimen: Bone; Tissue  Result Value Ref Range Status   Specimen Description BONE  Final   Special Requests NONE  Final   Gram Stain   Final    NO WBC SEEN NO ORGANISMS SEEN Performed at [facility], [address] [place], [place] [address]    Culture   Final    RARE CANDIDA ALBICANS RARE ESCHERICHIA COLI NO ANAEROBES ISOLATED; CULTURE IN PROGRESS FOR 5 DAYS    Report Status PENDING  Incomplete   Organism ID, Bacteria ESCHERICHIA COLI  Final      Susceptibility   Escherichia coli - MIC*    AMPICILLIN >=32 RESISTANT Resistant     CEFAZOLIN <=4 SENSITIVE Sensitive     CEFEPIME <=0.12 SENSITIVE Sensitive     CEFTAZIDIME <=1 SENSITIVE Sensitive     CEFTRIAXONE <=0.25 SENSITIVE Sensitive     CIPROFLOXACIN 0.5 SENSITIVE Sensitive     GENTAMICIN <=1 SENSITIVE Sensitive     IMIPENEM <=0.25 SENSITIVE Sensitive     TRIMETH/SULFA >=320 RESISTANT Resistant     AMPICILLIN/SULBACTAM >=32 RESISTANT Resistant     PIP/TAZO <=4 SENSITIVE Sensitive     * RARE ESCHERICHIA COLI  Aerobic/Anaerobic Culture (surgical/deep wound)     Status: None (Preliminary result)   Collection Time: 12/09/20 10:06 AM   Specimen: Foot, Right; Wound  Result Value Ref Range Status   Specimen Description FOOT  Final   Special Requests RIGHT  Final   Gram Stain   Final    NO WBC SEEN NO ORGANISMS SEEN Performed at [facility], [address], [place], [place] [address]    Culture   Final    FEW CANDIDA ALBICANS RARE ESCHERICHIA COLI NO ANAEROBES ISOLATED; CULTURE IN PROGRESS FOR 5 DAYS    Report Status PENDING  Incomplete   Organism ID, Bacteria ESCHERICHIA COLI  Final      Susceptibility   Escherichia coli - MIC*    AMPICILLIN >=32 RESISTANT Resistant     CEFAZOLIN <=4 SENSITIVE Sensitive     CEFEPIME <=0.12 SENSITIVE Sensitive     CEFTAZIDIME <=1  SENSITIVE Sensitive     CEFTRIAXONE <=0.25 SENSITIVE Sensitive     CIPROFLOXACIN  0.5 SENSITIVE Sensitive     GENTAMICIN <=1 SENSITIVE Sensitive     IMIPENEM <=0.25 SENSITIVE Sensitive     TRIMETH/SULFA >=320 RESISTANT Resistant     AMPICILLIN/SULBACTAM >=32 RESISTANT Resistant     PIP/TAZO <=4 SENSITIVE Sensitive     * RARE ESCHERICHIA COLI     Marcos EkeGreg Zema Lizardo, NP [facility]  12/13/2020  11:14 AM

## 2020-12-13 NOTE — Anesthesia Preprocedure Evaluation (Addendum)
Anesthesia Evaluation  Patient identified by MRN, date of birth, ID band Patient awake    Reviewed: Allergy & Precautions, NPO status , Patient's Chart, lab work & pertinent test results, reviewed documented beta blocker date and time   Airway Mallampati: II  TM Distance: >3 FB Neck ROM: Full    Dental  (+) Missing,    Pulmonary Current Smoker,    Pulmonary exam normal        Cardiovascular hypertension, Pt. on medications and Pt. on home beta blockers + Peripheral Vascular Disease  Normal cardiovascular exam     Neuro/Psych negative neurological ROS  negative psych ROS   GI/Hepatic negative GI ROS, Neg liver ROS,   Endo/Other  diabetes, Poorly Controlled, Type 2, Insulin Dependent  Renal/GU negative Renal ROS  negative genitourinary   Musculoskeletal Osteomyelitis right foot   Abdominal   Peds  Hematology negative hematology ROS (+)   Anesthesia Other Findings   Reproductive/Obstetrics                            Anesthesia Physical  Anesthesia Plan  ASA: III  Anesthesia Plan: MAC   Post-op Pain Management:    Induction: Intravenous  PONV Risk Score and Plan: 0 and Propofol infusion and Treatment may vary due to age or medical condition  Airway Management Planned: Simple Face Mask, Natural Airway and Nasal Cannula  Additional Equipment: None  Intra-op Plan:   Post-operative Plan:   Informed Consent: I have reviewed the patients History and Physical, chart, labs and discussed the procedure including the risks, benefits and alternatives for the proposed anesthesia with the patient or authorized representative who has indicated his/her understanding and acceptance.       Plan Discussed with:   Anesthesia Plan Comments:         Anesthesia Quick Evaluation

## 2020-12-13 NOTE — Progress Notes (Addendum)
PROGRESS NOTE    Benjamin Russo  RWE:[id number] DOB: June 15, 1976 DOA: 12/04/2020 PCP: Charlott Rakes, MD    Brief Narrative:  Mrs. Benjamin Russo was admitted to the hospital with the working diagnosis of osteomyelitis and gangrene of right 3 toe, 3 to 5 metatarsal.   44 yo male with past medical history of T2DM, HTN, tobacco abuse and peripheral vascular disease. Recent right foot 4th toe amputation due to gangrene about 2 weeks ago. For the last 7 days before admission developed discoloration and edema on her 5th toe right foot. She was evaluated at Dr. [name] office (podiatry) and was referred to hospitalization for further management. Patient reported fever, chills along of right foot edema and wound drainage. On her initial physical examination blood pressure 137/80, HR 88, RR 17, Temp 98.3 and 02 sat 99%, her lungs were clear to auscultation, heart s1 and s2 present, abdomen soft. Right foot with erythema, edema and signs of gangrene at the 5th toe.   He was placed on broad spectrum antibiotic therapy (vancomycin, cefepime, eraxis) and obtained ABI, showing moderately depressed toe pressures, consistent with small vessel disease not candidate for endovascular or open vascular intervention.   Right foot MRI concerning for osteomyelitis third toe, third to fifth metatarsal bones and diffuse soft tissue inflammation.   Patient underwent I&D on 12/09 fifth ray partial amputation and metatarsectomy fourth partial metatarsal.  12/11, third metatarsal head resection, debridement and delay closure.   Surgical cultures positive for candidal and E. Coli.    Assessment & Plan:   Principal Problem:   Acute osteomyelitis of toe, right (HCC) Active Problems:   Gangrene (HCC)   Hypertension   Type 2 diabetes mellitus (HCC)   Cellulitis of right foot   Ulcer of right foot, with necrosis of bone (HCC)   Post-op pain   Post-operative state   1. Osteomyelitis right foot, 3 toe and 3 to 5th  metatarsal. Sp partial 4th anf 5th ray amputation, 3rd metatarsal resection. Cultures positive for candida and E coli.  Pain is well controlled, patient has a wound vac in place. Plan for debridement today, obtain new culture.  Wbc today at 9,1/ blood cultures with no growth.   Continue with antibiotic with fluconazole and cefazolin.   Follow on cultures results, wbc and ID recommendations.   2. HTN/ peripheral vascular disease. Blood pressure controlled, 138/78 mmHg. On metoprolol 25 mg XL with good toleration.  Continue with aspirin.   3. T2DM/ dyslipidemia. Fasting glucose this am 160, continue insulin regimen. Patient is tolerating po well.  Sliding scale, pre-meal aspart 3 units and basal 10 units bid glargine.   Continue with asa and statin therapy.   4. Chronic anemia/ iron deficiency anemia Hgb at 8,3 and hct at 25,4. Iron panel with serum iron 22, TIBC 232, Transferrin saturation 9 and ferritin 436. Patient with no sepsis, will add IV iron.   5. Hyponatremia. Resolved hyponatremia with serum Na at 136 with preserved renal function with cr at 1,05 and BUN at 23, bicarbonate 25.   6. Overweight. BMI 27.6  Patient continue to be at high risk for worsening osteomyelitis.   Status is: Inpatient  Remains inpatient appropriate because:IV treatments appropriate due to intensity of illness or inability to take PO   Dispo: The patient is from: Home              Anticipated d/c is to: Home              Anticipated d/c  date is: 2 days              Patient currently is not medically stable to d/c.   DVT prophylaxis:  enoxaparin   Code Status:   full  Family Communication:  I spoke with patient's family at the bedside, we talked in detail about patient's condition, plan of care and prognosis and all questions were addressed.    Consultants:   Podiatry   ID   Procedures:  partial 4th anf 5th ray amputation, 3rd metatarsal resection  Antimicrobials:    cefazolin and  fluconazole    Subjective: Patient with pain at the right foot controlled with analgesics, no nausea or vomiting, no dyspnea or chest pain.   Objective: Vitals:   12/12/20 1947 12/13/20 0353 12/13/20 0811 12/13/20 1005  BP: 130/80 129/71 138/78 138/78  Pulse: 77 70 68 68  Resp: 18 16 17    Temp: 98.3 F (36.8 C) 98.4 F (36.9 C) 98.1 F (36.7 C)   TempSrc:   Oral   SpO2: 99% 100% 99%   Weight:      Height:        Intake/Output Summary (Last 24 hours) at 12/13/2020 1242 Last data filed at 12/13/2020 1000 Gross per 24 hour  Intake 500 ml  Output 1700 ml  Net -1200 ml   Filed Weights   12/04/20 2111 12/07/20 1758  Weight: 87.3 kg 87.3 kg    Examination:   General: Not in pain or dyspnea. Deconditioned  Neurology: Awake and alert, non focal  E ENT: no pallor, no icterus, oral mucosa moist Cardiovascular: No JVD. S1-S2 present, rhythmic, no gallops, rubs, or murmurs. No lower extremity edema. Pulmonary: positive breath sounds bilaterally, adequate air movement, no wheezing, rhonchi or rales. Gastrointestinal. Abdomen soft and non tender Skin. No rashes Musculoskeletal: right foot with wound vac in place.      Data Reviewed: I have personally reviewed following labs and imaging studies  CBC: Recent Labs  Lab 12/08/20 0350 12/09/20 0259 12/10/20 0450 12/11/20 0041 12/13/20 0331  WBC 12.4* 11.6* 10.5 9.2 9.1  NEUTROABS 11.2*  --  7.4  --   --   HGB 8.5* 7.9* 7.9* 8.3* 8.3*  HCT 26.2* 23.0* 24.9* 25.9* 25.4*  MCV 74.4* 73.2* 74.6* 74.9* 74.5*  PLT 309 323 299 300 311   Basic Metabolic Panel: Recent Labs  Lab 12/08/20 0350 12/08/20 0556 12/09/20 0259 12/10/20 0450 12/13/20 0331  NA 132* 132* 137 135 136  K 4.5 4.6 4.4 4.3 3.9  CL 99 100 106 103 103  CO2 21* 22 23 23 25   GLUCOSE 427* 378* 193* 159* 160*  BUN 22* 21* 19 18 23*  CREATININE 1.31* 1.31* 0.98 1.03 1.05  CALCIUM 8.6* 8.4* 8.6* 8.8* 8.9  MG 1.8  --  1.9 1.7  --   PHOS 2.2*  --  2.9 3.4   --    GFR: Estimated Creatinine Clearance: 94.6 mL/min (by C-G formula based on SCr of 1.05 mg/dL). Liver Function Tests: Recent Labs  Lab 12/08/20 0350 12/09/20 0259 12/10/20 0450 12/13/20 0331  AST  --   --   --  30  ALT  --   --   --  42  ALKPHOS  --   --   --  68  BILITOT  --   --   --  0.5  PROT  --   --   --  6.6  ALBUMIN 2.5* 2.4* 2.4* 2.6*   No results for input(s): LIPASE,  AMYLASE in the last 168 hours. No results for input(s): AMMONIA in the last 168 hours. Coagulation Profile: No results for input(s): INR, PROTIME in the last 168 hours. Cardiac Enzymes: No results for input(s): CKTOTAL, CKMB, CKMBINDEX, TROPONINI in the last 168 hours. BNP (last 3 results) No results for input(s): PROBNP in the last 8760 hours. HbA1C: No results for input(s): HGBA1C in the last 72 hours. CBG: Recent Labs  Lab 12/12/20 1143 12/12/20 1640 12/12/20 1946 12/13/20 0638 12/13/20 1143  GLUCAP 158* 155* 212* 177* 85   Lipid Profile: No results for input(s): CHOL, HDL, LDLCALC, TRIG, CHOLHDL, LDLDIRECT in the last 72 hours. Thyroid Function Tests: No results for input(s): TSH, T4TOTAL, FREET4, T3FREE, THYROIDAB in the last 72 hours. Anemia Panel: No results for input(s): VITAMINB12, FOLATE, FERRITIN, TIBC, IRON, RETICCTPCT in the last 72 hours.    Radiology Studies: I have reviewed all of the imaging during this hospital visit personally     Scheduled Meds: . enoxaparin (LOVENOX) injection  40 mg Subcutaneous Q24H  . fluconazole  400 mg Oral Daily  .  HYDROmorphone (DILAUDID) injection  2 mg Intravenous Once  . insulin aspart  0-15 Units Subcutaneous TID WC  . insulin aspart  0-5 Units Subcutaneous QHS  . insulin aspart  3 Units Subcutaneous TID WC  . insulin glargine  10 Units Subcutaneous BID  . metoprolol succinate  25 mg Oral Daily   Continuous Infusions: .  ceFAZolin (ANCEF) IV       LOS: 9 days        Nuvia Hileman Annett Gula, MD

## 2020-12-13 NOTE — Transfer of Care (Signed)
Immediate Anesthesia Transfer of Care Note  Patient: Benjamin Russo  Procedure(s) Performed: IRRIGATION AND DEBRIDEMENT OF FOOT (Right Foot) BONE BIOPSY (Right Foot)  Patient Location: PACU  Anesthesia Type:MAC  Level of Consciousness: awake  Airway & Oxygen Therapy: Patient Spontanous Breathing and Patient connected to face mask oxygen  Post-op Assessment: Report given to RN and Post -op Vital signs reviewed and stable  Post vital signs: Reviewed and stable  Last Vitals:  Vitals Value Taken Time  BP 103/70 12/13/20 1532  Temp    Pulse 80 12/13/20 1532  Resp 23 12/13/20 1532  SpO2 99 % 12/13/20 1532  Vitals shown include unvalidated device data.  Last Pain:  Vitals:   12/13/20 0811  TempSrc: Oral  PainSc:       Patients Stated Pain Goal: 2 (07/31/22 3612)  Complications: No complications documented.

## 2020-12-13 NOTE — Anesthesia Procedure Notes (Signed)
Procedure Name: MAC Date/Time: 12/13/2020 2:56 PM Performed by: Lieutenant Diego, CRNA Pre-anesthesia Checklist: Patient identified, Emergency Drugs available, Suction available, Patient being monitored and Timeout performed Patient Re-evaluated:Patient Re-evaluated prior to induction Oxygen Delivery Method: Simple face mask Preoxygenation: Pre-oxygenation with 100% oxygen Induction Type: IV induction

## 2020-12-13 NOTE — Anesthesia Postprocedure Evaluation (Signed)
Anesthesia Post Note  Patient: Johndaniel Catlin  Procedure(s) Performed: IRRIGATION AND DEBRIDEMENT OF FOOT (Right Foot) BONE BIOPSY (Right Foot)     Patient location during evaluation: PACU Anesthesia Type: MAC Level of consciousness: awake and alert Pain management: pain level controlled Vital Signs Assessment: post-procedure vital signs reviewed and stable Respiratory status: spontaneous breathing, nonlabored ventilation and respiratory function stable Cardiovascular status: blood pressure returned to baseline and stable Postop Assessment: no apparent nausea or vomiting Anesthetic complications: no   No complications documented.  Last Vitals:  Vitals:   12/13/20 1615 12/13/20 1630  BP: 135/83 139/77  Pulse: 69   Resp: 15   Temp:  36.6 C  SpO2: 100%     Last Pain:  Vitals:   12/13/20 1600  TempSrc:   PainSc: 0-No pain                 Lidia Collum

## 2020-12-14 ENCOUNTER — Inpatient Hospital Stay: Payer: Self-pay

## 2020-12-14 ENCOUNTER — Other Ambulatory Visit (HOSPITAL_COMMUNITY): Payer: Self-pay | Admitting: Internal Medicine

## 2020-12-14 ENCOUNTER — Encounter (HOSPITAL_COMMUNITY): Payer: Self-pay | Admitting: Podiatry

## 2020-12-14 DIAGNOSIS — Z9889 Other specified postprocedural states: Secondary | ICD-10-CM

## 2020-12-14 LAB — ACID FAST SMEAR (AFB, MYCOBACTERIA)
Acid Fast Smear: NEGATIVE
Acid Fast Smear: NEGATIVE

## 2020-12-14 LAB — AEROBIC/ANAEROBIC CULTURE W GRAM STAIN (SURGICAL/DEEP WOUND): Gram Stain: NONE SEEN

## 2020-12-14 LAB — COMPREHENSIVE METABOLIC PANEL
ALT: 41 U/L (ref 0–44)
AST: 33 U/L (ref 15–41)
Albumin: 2.6 g/dL — ABNORMAL LOW (ref 3.5–5.0)
Alkaline Phosphatase: 75 U/L (ref 38–126)
Anion gap: 11 (ref 5–15)
BUN: 19 mg/dL (ref 6–20)
CO2: 24 mmol/L (ref 22–32)
Calcium: 9.2 mg/dL (ref 8.9–10.3)
Chloride: 103 mmol/L (ref 98–111)
Creatinine, Ser: 0.93 mg/dL (ref 0.61–1.24)
GFR, Estimated: >60 mL/min (ref >60)
Glucose, Bld: 173 mg/dL — ABNORMAL HIGH (ref 70–99)
Potassium: 4.4 mmol/L (ref 3.5–5.1)
Sodium: 138 mmol/L (ref 135–145)
Total Bilirubin: 0.2 mg/dL — ABNORMAL LOW (ref 0.3–1.2)
Total Protein: 6.5 g/dL (ref 6.5–8.1)

## 2020-12-14 LAB — GLUCOSE, CAPILLARY
Glucose-Capillary: 131 mg/dL — ABNORMAL HIGH (ref 70–99)
Glucose-Capillary: 136 mg/dL — ABNORMAL HIGH (ref 70–99)
Glucose-Capillary: 158 mg/dL — ABNORMAL HIGH (ref 70–99)
Glucose-Capillary: 158 mg/dL — ABNORMAL HIGH (ref 70–99)
Glucose-Capillary: 161 mg/dL — ABNORMAL HIGH (ref 70–99)
Glucose-Capillary: 176 mg/dL — ABNORMAL HIGH (ref 70–99)
Glucose-Capillary: 95 mg/dL (ref 70–99)

## 2020-12-14 MED ORDER — JUVEN PO PACK
1.0000 | PACK | Freq: Two times a day (BID) | ORAL | Status: DC
  Administered 2020-12-14 – 2020-12-15 (×2): 1 via ORAL
  Filled 2020-12-14 (×2): qty 1

## 2020-12-14 MED ORDER — FLUCONAZOLE 200 MG PO TABS: 400.0000 mg | ORAL_TABLET | Freq: Every day | ORAL | 5 refills | Status: DC

## 2020-12-14 MED ORDER — ADULT MULTIVITAMIN W/MINERALS CH
1.0000 | ORAL_TABLET | Freq: Every day | ORAL | Status: DC
  Administered 2020-12-14 – 2020-12-15 (×2): 1 via ORAL
  Filled 2020-12-14 (×2): qty 1

## 2020-12-14 MED ORDER — POLYETHYLENE GLYCOL 3350 17 G PO PACK
17.0000 g | PACK | Freq: Two times a day (BID) | ORAL | Status: DC
  Administered 2020-12-14 – 2020-12-15 (×2): 17 g via ORAL
  Filled 2020-12-14 (×3): qty 1

## 2020-12-14 MED ORDER — PANTOPRAZOLE SODIUM 40 MG PO TBEC
40.0000 mg | DELAYED_RELEASE_TABLET | Freq: Every day | ORAL | Status: DC
  Administered 2020-12-14 – 2020-12-15 (×2): 40 mg via ORAL
  Filled 2020-12-14 (×2): qty 1

## 2020-12-14 MED ORDER — ENSURE MAX PROTEIN PO LIQD
11.0000 [oz_av] | Freq: Every day | ORAL | Status: DC
  Administered 2020-12-14: 22:00:00 11 [oz_av] via ORAL

## 2020-12-14 MED ORDER — IBUPROFEN 200 MG PO TABS: 400.0000 mg | ORAL_TABLET | Freq: Four times a day (QID) | ORAL | Status: DC | PRN

## 2020-12-14 MED ORDER — SODIUM CHLORIDE 0.9 % IV SOLN
125.0000 mg | Freq: Once | INTRAVENOUS | Status: AC
  Administered 2020-12-14: 16:00:00 125 mg via INTRAVENOUS
  Filled 2020-12-14: qty 10

## 2020-12-14 MED FILL — FLUCONAZOLE 200 MG TABLET: 200 | 30 days supply | Qty: 60 | Fill #0

## 2020-12-14 NOTE — Plan of Care (Signed)

## 2020-12-14 NOTE — Progress Notes (Signed)
PROGRESS NOTE    Benjamin Russo  EQA:[id number] DOB: 1976/09/28 DOA: 12/04/2020 PCP: Charlott Rakes, MD    Brief Narrative:  Benjamin Russo was admitted to the hospital with the working diagnosis of osteomyelitis and gangrene of right 3 toe, 3 to 5 metatarsal.   44 yo male with past medical history of T2DM, HTN, tobacco abuse and peripheral vascular disease. Recent right foot 4th toe amputation due to gangrene about 2 weeks ago. For the last 7 days before admission developed discoloration and edema on her 5th toe right foot. She was evaluated at Dr. [name] office (podiatry) and was referred to hospitalization for further management. Benjamin Russo reported fever, chills along of right foot edema and wound drainage. On her initial physical examination blood pressure 137/80, HR 88, RR 17, Temp 98.3 and 02 sat 99%, her lungs were clear to auscultation, heart s1 and s2 present, abdomen soft. Right foot with erythema, edema and signs of gangrene at the 5th toe.   He was placed on broad spectrum antibiotic therapy (vancomycin, cefepime, eraxis) and obtained ABI, showing moderately depressed toe pressures, consistent with small vessel disease not candidate for endovascular or open vascular intervention.   Right foot MRI concerning for osteomyelitis third toe, third to fifth metatarsal bones and diffuse soft tissue inflammation.   Benjamin Russo underwent I&D on 12/09 fifth ray partial amputation and metatarsectomy fourth partial metatarsal.  12/11, third metatarsal head resection, debridement and delay closure.   Surgical cultures positive for Candida Albicans and E. Coli.   12/15 Benjamin Russo underwent incision and drainage of right foot and wound vac application.   Plan to continue IV antibiotic therapy for a total of 6 weeks with ancef and oral fluconazole for 6 months.    Assessment & Plan:   Principal Problem:   Acute osteomyelitis of toe, right (HCC) Active Problems:   Gangrene (HCC)    Hypertension   Type 2 diabetes mellitus (HCC)   Cellulitis of right foot   Ulcer of right foot, with necrosis of bone (HCC)   Post-op pain   Post-operative state   1. Osteomyelitis right foot, 3 toe and 3 to 5th metatarsal. Sp partial 4th anf 5th ray amputation, 3rd metatarsal resection. Cultures positive for candida and E coli.  Benjamin Russo has a wound vac in place. Pain is well controlled with analgesics.  Plan to place PICC line today in preparation for discharge home in am, to continue outpatient IV antibiotic therapy with cefazolin and oral fluconazole.  Follow up with ID as outpatient, end of IV antibiotic 01/24/21, continue oral fluconazole for 6 mo.   2. HTN/ peripheral vascular disease. Continue blood pressure control with metoprolol.   3. T2DM/ dyslipidemia. Fasting glucose this am 173 mg/dl, Benjamin Russo tolerating po well, continue current regimen of insulin with sliding scale, pre-meal aspart 3 units and basal 10 units bid glargine.   On asa. Hold on statin therapy while on fluconazole  At home on metformin.   4. Chronic anemia/ iron deficiency anemia  Iron panel with serum iron 22, TIBC 232, Transferrin saturation 9 and ferritin 436. Will give 2nd dose of Ferrous gluconate IV today, will need outpatient follow up.   5. Hyponatremia. Electrolytes have been corrected, renal function stable.   6. Overweight. BMI 27.6, follow as outpatien  Status is: Inpatient  Remains inpatient appropriate because:IV treatments appropriate due to intensity of illness or inability to take PO   Dispo: The Benjamin Russo is from: Home  Anticipated d/c is to: Home              Anticipated d/c date is: 1 day              Benjamin Russo currently is not medically stable to d/c. Plan for possible discharge home in am.,    DVT prophylaxis: Enoxaparin   Code Status:   full  Family Communication:  No family at the bedside     Consultants:   Podiatry   ID   Procedures:   Benjamin Russo  underwent I&D on 12/09 fifth ray partial amputation and metatarsectomy fourth partial metatarsal.  12/11, third metatarsal head resection, debridement and delay closure. 12/15 I&D and wound vac application   Antimicrobials:   Cefazolin and fluconazole     Subjective: Benjamin Russo continue to be very weak and deconditioned, no significant pain at the surgical wound. Positive constipation, but not abdominal pain, no nausea or vomiting.   Objective: Vitals:   12/13/20 2242 12/14/20 0039 12/14/20 0351 12/14/20 0813  BP: 139/65 132/67 133/61 120/72  Pulse: 77 75 69 70  Resp: 15 16 17 17   Temp: 98.3 F (36.8 C) 98.1 F (36.7 C) 98 F (36.7 C) 97.8 F (36.6 C)  TempSrc: Oral Oral Oral Oral  SpO2: 97% 98% 99% 97%  Weight:      Height:        Intake/Output Summary (Last 24 hours) at 12/14/2020 1243 Last data filed at 12/14/2020 1100 Gross per 24 hour  Intake 740 ml  Output 3900 ml  Net -3160 ml   Filed Weights   12/04/20 2111 12/07/20 1758 12/13/20 1332  Weight: 87.3 kg 87.3 kg 87.3 kg    Examination:   General: Not in pain or dyspnea, deconditioned  Neurology: Awake and alert, non focal  E ENT: mild pallor, no icterus, oral mucosa moist Cardiovascular: No JVD. S1-S2 present, rhythmic, no gallops, rubs, or murmurs. No lower extremity edema. Pulmonary: vesicular breath sounds bilaterally, adequate air movement, no wheezing, rhonchi or rales. Gastrointestinal. Abdomen soft and non tender Skin. No rashes Musculoskeletal: right foot with dressing in place, wound vac in place.      Data Reviewed: I have personally reviewed following labs and imaging studies  CBC: Recent Labs  Lab 12/08/20 0350 12/09/20 0259 12/10/20 0450 12/11/20 0041 12/13/20 0331  WBC 12.4* 11.6* 10.5 9.2 9.1  NEUTROABS 11.2*  --  7.4  --   --   HGB 8.5* 7.9* 7.9* 8.3* 8.3*  HCT 26.2* 23.0* 24.9* 25.9* 25.4*  MCV 74.4* 73.2* 74.6* 74.9* 74.5*  PLT 309 323 299 300 311   Basic Metabolic  Panel: Recent Labs  Lab 12/08/20 0350 12/08/20 0556 12/09/20 0259 12/10/20 0450 12/13/20 0331 12/14/20 0323  NA 132* 132* 137 135 136 138  K 4.5 4.6 4.4 4.3 3.9 4.4  CL 99 100 106 103 103 103  CO2 21* 22 23 23 25 24   GLUCOSE 427* 378* 193* 159* 160* 173*  BUN 22* 21* 19 18 23* 19  CREATININE 1.31* 1.31* 0.98 1.03 1.05 0.93  CALCIUM 8.6* 8.4* 8.6* 8.8* 8.9 9.2  MG 1.8  --  1.9 1.7  --   --   PHOS 2.2*  --  2.9 3.4  --   --    GFR: Estimated Creatinine Clearance: 106.8 mL/min (by C-G formula based on SCr of 0.93 mg/dL). Liver Function Tests: Recent Labs  Lab 12/08/20 0350 12/09/20 0259 12/10/20 0450 12/13/20 0331 12/14/20 0323  AST  --   --   --  30 33  ALT  --   --   --  42 41  ALKPHOS  --   --   --  68 75  BILITOT  --   --   --  0.5 0.2*  PROT  --   --   --  6.6 6.5  ALBUMIN 2.5* 2.4* 2.4* 2.6* 2.6*   No results for input(s): LIPASE, AMYLASE in the last 168 hours. No results for input(s): AMMONIA in the last 168 hours. Coagulation Profile: No results for input(s): INR, PROTIME in the last 168 hours. Cardiac Enzymes: No results for input(s): CKTOTAL, CKMB, CKMBINDEX, TROPONINI in the last 168 hours. BNP (last 3 results) No results for input(s): PROBNP in the last 8760 hours. HbA1C: No results for input(s): HGBA1C in the last 72 hours. CBG: Recent Labs  Lab 12/13/20 2011 12/14/20 0152 12/14/20 0357 12/14/20 0816 12/14/20 1109  GLUCAP 194* 161* 176* 158* 131*   Lipid Profile: No results for input(s): CHOL, HDL, LDLCALC, TRIG, CHOLHDL, LDLDIRECT in the last 72 hours. Thyroid Function Tests: No results for input(s): TSH, T4TOTAL, FREET4, T3FREE, THYROIDAB in the last 72 hours. Anemia Panel: No results for input(s): VITAMINB12, FOLATE, FERRITIN, TIBC, IRON, RETICCTPCT in the last 72 hours.    Radiology Studies: I have reviewed all of the imaging during this hospital visit personally     Scheduled Meds: . enoxaparin (LOVENOX) injection  40 mg  Subcutaneous Q24H  . fluconazole  400 mg Oral Daily  .  HYDROmorphone (DILAUDID) injection  2 mg Intravenous Once  . insulin aspart  0-15 Units Subcutaneous TID WC  . insulin aspart  0-5 Units Subcutaneous QHS  . insulin aspart  3 Units Subcutaneous TID WC  . insulin glargine  10 Units Subcutaneous BID  . metoprolol succinate  25 mg Oral Daily   Continuous Infusions: .  ceFAZolin (ANCEF) IV 2 g (12/14/20 0543)     LOS: 10 days        Niklaus Mamaril Annett Gula, MD

## 2020-12-14 NOTE — Progress Notes (Signed)
Initial Nutrition Assessment  DOCUMENTATION CODES:   Not applicable  INTERVENTION:   -MVI with minerals daily -Ensure Max po daily, each supplement provides 150 kcal and 30 grams of protein.  -1 packet Juven BID, each packet provides 95 calories, 2.5 grams of protein (collagen), and 9.8 grams of carbohydrate (3 grams sugar); also contains 7 grams of L-arginine and L-glutamine, 300 mg vitamin C, 15 mg vitamin E, 1.2 mcg vitamin B-12, 9.5 mg zinc, 200 mg calcium, and 1.5 g  Calcium Beta-hydroxy-Beta-methylbutyrate to support wound healing  NUTRITION DIAGNOSIS:   Increased nutrient needs related to wound healing as evidenced by estimated needs.  GOAL:   Patient will meet greater than or equal to 90% of their needs  MONITOR:   PO intake,Supplement acceptance,Labs,Skin,I & O's,Weight trends  REASON FOR ASSESSMENT:   Consult Assessment of nutrition requirement/status  ASSESSMENT:   Benjamin Russo is a 44 y.o. male with history of diabetes mellitus type 2 for the last 20 years takes insulin with history of hypertension tobacco abuse had amputation of his fourth toe about 2 weeks ago for gangrenous looking toe.  About a week later patient noticed discoloration in the fifth toe and also some discharge.  Patient was referred to Dr. [name] and was referred to hospital for admission.  Pt admitted with cellulitis and possible osteomyelitis of rt foot.   12/9- s/p Procedures:             1) Incision and drainage of complex foot abscess below the deep fascia             2) Amputation fifth ray partial             3) Metatarsectomy partial fourth metatarsal 12/11- s/p Procedures:             1) Incision and drainage of complex foot abscess below the deep fascia             2) Amputation fifth ray partial             3) Metatarsectomy partial fourth metatarsal 12/15- s/p Procedures:             1) Incision and drainage of right foot for osteomyelitis             2) Application of wound  VAC  Reviewed I/O's: -3.1 L x 24 hours and -7.9 L since admission  UOP: 3.8 L x 24 hours  Spoke with pt at bedside, who was pleasant and in good spirits today. He shares that he is frustrated about being in the hospital for so long, however, expressed that he is willing to do whatever is needed to get him better.   Pt shares that he has a good appetite. He consumed about 75% of breakfast. Pt has been supplementing with food brought in by wife, mainly fruits. Pt reports that he often grazes throughout the day, but has been trying to decrease his carbohydrate intake (tamales and tortillas) to help control CBGS. Per pt, he was obtaining readings from 103-170. He noted that just PTA reading were gradually rising to 200-300's.   Pt shares he has lost about 10-20 pound intentionally.   Had long discussion with pt about importance of good glycemic control to support wound healing. RD reviewed self-management practices and impact of well controlled blood sugar on wound healing. RD also discussed importance of increased protein intake and provided specific ways that he could include more protein in diet. He is amenable to Ensure  Max, Juven, and MVI.   Pt shares that he and his wife are concerned about anemia and vitamin D levels, as he noticed that he looks more pale and has not been outdoors much. RD did not notice any signs of micronutrient deficiencies on exam. Pt amenable to taking a MVI to help support wound healing.   RD actively listened to pt concerns and provided emotional support. He was very appreciative of RD visit.   Lab Results  Component Value Date   HGBA1C 9.1 (H) 12/06/2020   PTA DM medications are 19 units insulin glargine-lixisenatide daily and 500 mg metformin BID.   Labs reviewed: CBGS: 158-176 (inpatient orders for glycemic control are 0-15 units insulin aspart TID with meal,s 0-5 units insulin aspart daily at bedtime, 3 units insulin aspart TID with meals, and 10 units insulin  glargine BID ).   NUTRITION - FOCUSED PHYSICAL EXAM:  Flowsheet Row Most Recent Value  Orbital Region No depletion  Upper Arm Region No depletion  Thoracic and Lumbar Region No depletion  Buccal Region No depletion  Temple Region No depletion  Clavicle Bone Region No depletion  Clavicle and Acromion Bone Region No depletion  Scapular Bone Region No depletion  Dorsal Hand No depletion  Patellar Region No depletion  Anterior Thigh Region No depletion  Posterior Calf Region No depletion  Edema (RD Assessment) None  Hair Reviewed  Eyes Reviewed  Mouth Reviewed  Skin Reviewed  Nails Reviewed       Diet Order:   Diet Order            Diet heart healthy/carb modified Room service appropriate? Yes; Fluid consistency: Thin  Diet effective now                 EDUCATION NEEDS:   Education needs have been addressed  Skin:  Skin Assessment: Skin Integrity Issues: Skin Integrity Issues:: Wound VAC Wound Vac: rt foot  Last BM:  12/13/20  Height:   Ht Readings from Last 1 Encounters:  12/13/20 5\' 10"  (1.778 m)    Weight:   Wt Readings from Last 1 Encounters:  12/13/20 87.3 kg    Ideal Body Weight:  75.5 kg  BMI:  Body mass index is 27.62 kg/m.  Estimated Nutritional Needs:   Kcal:  12/15/20  Protein:  115-130 grams  Fluid:  > 2 L    [id number], RD, LDN, CDCES Registered Dietitian II Certified Diabetes Care and Education Specialist Please refer to [facility] for RD and/or RD on-call/weekend/after hours pager

## 2020-12-14 NOTE — TOC Initial Note (Addendum)
Transition of Care [facility]) - Initial/Assessment Note    Patient Details  Name: Benjamin Russo MRN: [id number] Date of Birth: 28-Oct-1976  Transition of Care [facility]) CM/SW Contact:    Epifanio Lesches, RN Phone Number: 12/14/2020, 5:05 PM  Clinical Narrative:                  Right foot osteomyelitis. From home with wife    - S/P Incision and drainage of right foot for osteomyelitis and application of wound VAC, 12/15  Pt will need LT IV ABX until 01/25/2020. PICC line pending. Pam with [facility] to teach pt /wife administration of IV ABX therapy prior to d/c. [facility] to provide [facility] service.  Arminda Resides (Spouse)      [phone]       [facility] team following for needs ......  Expected Discharge Plan: Home w Home Health Services Barriers to Discharge: Unsafe home situation   Patient Goals and CMS Choice     Choice offered to / list presented to : Patient  Expected Discharge Plan and Services Expected Discharge Plan: Home w Home Health Services                           DME Agency: Other - Comment ([facility]/ IV ABX therapy) Date DME Agency Contacted: 12/14/20 Time DME Agency Contacted: 26 Representative spoke with at DME Agency: Pam HH Arranged: RN HH Agency:  ([facility]) Date HH Agency Contacted: 12/14/20 Time HH Agency Contacted: 1703 Representative spoke with at [facility] Agency: Pam  Prior Living Arrangements/Services                       Activities of Daily Living Home Assistive Devices/Equipment: None ADL Screening (condition at time of admission) Patient's cognitive ability adequate to safely complete daily activities?: Yes Is the patient deaf or have difficulty hearing?: No Does the patient have difficulty seeing, even when wearing glasses/contacts?: No Does the patient have difficulty concentrating, remembering, or making decisions?: No Patient able to express need for assistance with ADLs?: Yes Does the patient have  difficulty dressing or bathing?: No Independently performs ADLs?: Yes (appropriate for developmental age) Does the patient have difficulty walking or climbing stairs?: No Weakness of Legs: None Weakness of Arms/Hands: None  Permission Sought/Granted Permission sought to share information with : Case Manager                Emotional Assessment              Admission diagnosis:  Cellulitis of right foot [L03.115] Patient Active Problem List   Diagnosis Date Noted  . Post-op pain   . Post-operative state   . Ulcer of right foot, with necrosis of bone (HCC)   . Cellulitis of right foot 12/05/2020  . Acute osteomyelitis of toe, right (HCC) 11/22/2020  . Postoperative examination 11/22/2020  . Cellulitis and abscess of toe of right foot 11/14/2020  . Gangrene (HCC) 11/14/2020  . Screening examination for other arthropod-borne viral diseases 11/14/2020  . Cellulitis 10/15/2020  . Hypertension 10/15/2020  . Type 2 diabetes mellitus (HCC) 10/15/2020  . Cellulitis of leg, right 07/23/2018   PCP:  Charlott Rakes, MD Pharmacy:   [facility] [phone] Rosalita Levan, [place] - [address] AT [facility] [address] [place] [address] Phone: [phone] Fax: [phone]  [facility] -  [place], [place] - [address] [place] [address] Phone: [phone] Fax: [phone]     Social Determinants of Health ([place]) Interventions    Readmission Risk Interventions No flowsheet data found.

## 2020-12-14 NOTE — Progress Notes (Signed)
PHARMACY CONSULT NOTE FOR:  OUTPATIENT  PARENTERAL ANTIBIOTIC THERAPY (OPAT)  Indication: Right foot osteomyelitis  Regimen: Cefazolin 2 gm IV Q 8 hours  End date: 01/24/2021  IV antibiotic discharge orders are pended. To discharging provider:  please sign these orders via discharge navigator,  Select New Orders & click on the button choice - Manage This Unsigned Work.     Thank you for allowing pharmacy to be a part of this patient's care.  Sharin Mons, PharmD, BCPS, BCIDP Infectious Diseases Clinical Pharmacist Phone: [phone] 12/14/2020, 11:21 AM

## 2020-12-14 NOTE — Progress Notes (Signed)
[facility] for Infectious Disease  Date of Admission:  12/04/2020     Total days of antibiotics 11         ASSESSMENT:  Mr. Peggye Ley is POD #1 from repeat debridement.  Surgical specimen grams stains with no organisms seen and there is no growth in <24 hours on either culture. Discussed plan of care to include PICC line placement and treatment with fluconazole for at least 6 months and Ancef for 6 weeks with cultures positive for Candida albicans and E. Coli, respectively. Home Health and OPAT orders placed below. Transition of Care to bring fluconazole to bedside for discharge.  [place] for discharge from ID standpoint.  Arrange follow up in ID office.   PLAN:  1. Continue current dose of Ancef for 6 weeks and fluconazole through at least 6 months.   2. Continue to monitor cultures and adjust antibiotic therapy as able.  3. OPAT and Home Health orders below.  4. Continue wound care per Dr. March Rummage. 5. Follow up in the ID office.   Diagnosis: Osteomyelitis   Culture Result: Candida Albicans and E. Coli   No Known Allergies  OPAT Orders Discharge antibiotics to be given via PICC line Discharge antibiotics: Ancef Per pharmacy protocol   Duration: 6 weeks   End Date: 01/24/21  [facility] Care Per Protocol:  Home health RN for IV administration and teaching; PICC line care and labs.    Labs weekly while on IV antibiotics: _X_ CBC with differential __ BMP _X_ CMP _X_ CRP _X_ ESR __ Vancomycin trough __ CK  _X_ Please pull PIC at completion of IV antibiotics __ Please leave PIC in place until doctor has seen patient or been notified  Fax weekly labs to [phone]  Clinic Follow Up Appt:  1/21 at 2:30 pm with Dr. Tommy Medal   Principal Problem:   Acute osteomyelitis of toe, right ([place]) Active Problems:   Gangrene ([place])   Hypertension   Type 2 diabetes mellitus ([place])   Cellulitis of right foot   Ulcer of right foot, with necrosis of bone (HCC)   Post-op pain    Post-operative state    enoxaparin (LOVENOX) injection  40 mg Subcutaneous Q24H   fluconazole  400 mg Oral Daily    HYDROmorphone (DILAUDID) injection  2 mg Intravenous Once   insulin aspart  0-15 Units Subcutaneous TID WC   insulin aspart  0-5 Units Subcutaneous QHS   insulin aspart  3 Units Subcutaneous TID WC   insulin glargine  10 Units Subcutaneous BID   metoprolol succinate  25 mg Oral Daily    SUBJECTIVE:  Afebrile overnight with no acute events. Has questions about cultures and plan of care. Denies fevers/chills.   No Known Allergies   Review of Systems: Review of Systems  Constitutional: Negative for chills, fever and weight loss.  Respiratory: Negative for cough, shortness of breath and wheezing.   Cardiovascular: Negative for chest pain and leg swelling.  Gastrointestinal: Negative for abdominal pain, constipation, diarrhea, nausea and vomiting.  Skin: Negative for rash.      OBJECTIVE: Vitals:   12/13/20 2242 12/14/20 0039 12/14/20 0351 12/14/20 0813  BP: 139/65 132/67 133/61 120/72  Pulse: 77 75 69 70  Resp: _0 Temp: 98.3 F (36.8 C) 98.1 F (36.7 C) 98 F (36.7 C) 97.8 F (36.6 C)  TempSrc: Oral Oral Oral Oral  SpO2: 97% 98% 99% 97%  Weight:      Height:  Body mass index is 27.62 kg/m.  Physical Exam Constitutional:      General: He is not in acute distress.    Appearance: He is well-developed and well-nourished.  Cardiovascular:     Rate and Rhythm: Normal rate and regular rhythm.     Pulses: Intact distal pulses.     Heart sounds: Normal heart sounds.  Pulmonary:     Effort: Pulmonary effort is normal.     Breath sounds: Normal breath sounds.  Musculoskeletal:     Comments: Post surgical dressing and wound vac to right foot.   Skin:    General: Skin is warm and dry.  Neurological:     Mental Status: He is alert.  Psychiatric:        Mood and Affect: Mood and affect normal.     Lab Results Lab Results   Component Value Date   WBC 9.1 12/13/2020   HGB 8.3 (L) 12/13/2020   HCT 25.4 (L) 12/13/2020   MCV 74.5 (L) 12/13/2020   PLT 311 12/13/2020    Lab Results  Component Value Date   CREATININE 0.93 12/14/2020   BUN 19 12/14/2020   NA 138 12/14/2020   K 4.4 12/14/2020   CL 103 12/14/2020   CO2 24 12/14/2020    Lab Results  Component Value Date   ALT 41 12/14/2020   AST 33 12/14/2020   ALKPHOS 75 12/14/2020   BILITOT 0.2 (L) 12/14/2020     Microbiology: Recent Results (from the past 240 hour(s))  Culture, blood (routine x 2)     Status: None   Collection Time: 12/05/20  4:14 AM   Specimen: BLOOD  Result Value Ref Range Status   Specimen Description BLOOD RIGHT HAND  Final   Special Requests   Final    BOTTLES DRAWN AEROBIC AND ANAEROBIC Blood Culture results may not be optimal due to an excessive volume of blood received in culture bottles   Culture   Final    NO GROWTH 5 DAYS Performed at [facility], [place] [address] [place], [place] [address]    Report Status 12/10/2020 FINAL  Final  Culture, blood (routine x 2)     Status: None   Collection Time: 12/05/20  4:25 AM   Specimen: BLOOD  Result Value Ref Range Status   Specimen Description BLOOD LEFT FOREARM  Final   Special Requests   Final    BOTTLES DRAWN AEROBIC AND ANAEROBIC Blood Culture results may not be optimal due to an excessive volume of blood received in culture bottles   Culture   Final    NO GROWTH 5 DAYS Performed at [facility], [place] [address] [place], [place] [address]    Report Status 12/10/2020 FINAL  Final  Surgical PCR screen     Status: None   Collection Time: 12/05/20  5:25 AM   Specimen: Nasal Mucosa; Nasal Swab  Result Value Ref Range Status   MRSA, PCR NEGATIVE NEGATIVE Final   Staphylococcus aureus NEGATIVE NEGATIVE Final    Comment: (NOTE) The Xpert SA Assay (FDA approved for NASAL specimens in patients 96 years of age and older), is one component of a  comprehensive surveillance program. It is not intended to diagnose infection nor to guide or monitor treatment. Performed at [facility], [place] [address] [place], [place] [address]   Resp Panel by RT-PCR (Flu A&B, Covid) Nasopharyngeal Swab     Status: None   Collection Time: 12/06/20  8:13 AM  Specimen: Nasopharyngeal Swab; Nasopharyngeal(NP) swabs in vial transport medium  Result Value Ref Range Status   SARS Coronavirus 2 by RT PCR NEGATIVE NEGATIVE Final    Comment: (NOTE) SARS-CoV-2 target nucleic acids are NOT DETECTED.  The SARS-CoV-2 RNA is generally detectable in upper respiratory specimens during the acute phase of infection. The lowest concentration of SARS-CoV-2 viral copies this assay can detect is 138 copies/mL. A negative result does not preclude SARS-Cov-2 infection and should not be used as the sole basis for treatment or other patient management decisions. A negative result may occur with  improper specimen collection/handling, submission of specimen other than nasopharyngeal swab, presence of viral mutation(s) within the areas targeted by this assay, and inadequate number of viral copies(<138 copies/mL). A negative result must be combined with clinical observations, patient history, and epidemiological information. The expected result is Negative.  Fact Sheet for Patients:  EntrepreneurPulse.com.au  Fact Sheet for Healthcare Providers:  IncredibleEmployment.be  This test is no t yet approved or cleared by the [place] FDA and  has been authorized for detection and/or diagnosis of SARS-CoV-2 by FDA under an Emergency Use Authorization (EUA). This EUA will remain  in effect (meaning this test can be used) for the duration of the COVID-19 declaration under Section 564(b)(1) of the Act, 21 U.S.C.section 360bbb-3(b)(1), unless the authorization is terminated  or revoked sooner.       Influenza A by PCR NEGATIVE  NEGATIVE Final   Influenza B by PCR NEGATIVE NEGATIVE Final    Comment: (NOTE) The Xpert Xpress SARS-CoV-2/FLU/RSV plus assay is intended as an aid in the diagnosis of influenza from Nasopharyngeal swab specimens and should not be used as a sole basis for treatment. Nasal washings and aspirates are unacceptable for Xpert Xpress SARS-CoV-2/FLU/RSV testing.  Fact Sheet for Patients: EntrepreneurPulse.com.au  Fact Sheet for Healthcare Providers: IncredibleEmployment.be  This test is not yet approved or cleared by the [place] FDA and has been authorized for detection and/or diagnosis of SARS-CoV-2 by FDA under an Emergency Use Authorization (EUA). This EUA will remain in effect (meaning this test can be used) for the duration of the COVID-19 declaration under Section 564(b)(1) of the Act, 21 U.S.C. section 360bbb-3(b)(1), unless the authorization is terminated or revoked.  Performed at [facility], [place] [address] [place] [address], [place], [place] [address]   Aerobic/Anaerobic Culture (surgical/deep wound)     Status: None (Preliminary result)   Collection Time: 12/09/20 10:04 AM   Specimen: Bone; Tissue  Result Value Ref Range Status   Specimen Description BONE  Final   Special Requests NONE  Final   Gram Stain NO WBC SEEN NO ORGANISMS SEEN   Final   Culture   Final    RARE CANDIDA ALBICANS RARE ESCHERICHIA COLI NO ANAEROBES ISOLATED Performed at [facility], [place] [address] [place], [place] [address]    Report Status PENDING  Incomplete   Organism ID, Bacteria ESCHERICHIA COLI  Final      Susceptibility   Escherichia coli - MIC*    AMPICILLIN >=32 RESISTANT Resistant     CEFAZOLIN <=4 SENSITIVE Sensitive     CEFEPIME <=0.12 SENSITIVE Sensitive     CEFTAZIDIME <=1 SENSITIVE Sensitive     CEFTRIAXONE <=0.25 SENSITIVE Sensitive     CIPROFLOXACIN 0.5 SENSITIVE Sensitive     GENTAMICIN <=1 SENSITIVE Sensitive     IMIPENEM <=0.25  SENSITIVE Sensitive     TRIMETH/SULFA >=320 RESISTANT Resistant     AMPICILLIN/SULBACTAM >=32 RESISTANT Resistant  PIP/TAZO <=4 SENSITIVE Sensitive     * RARE ESCHERICHIA COLI  Aerobic/Anaerobic Culture (surgical/deep wound)     Status: None   Collection Time: 12/09/20 10:06 AM   Specimen: Foot, Right; Wound  Result Value Ref Range Status   Specimen Description FOOT  Final   Special Requests RIGHT  Final   Gram Stain NO WBC SEEN NO ORGANISMS SEEN   Final   Culture   Final    FEW CANDIDA ALBICANS RARE ESCHERICHIA COLI NO ANAEROBES ISOLATED Performed at [facility], [address] [place], [place] [address]    Report Status 12/14/2020 FINAL  Final   Organism ID, Bacteria ESCHERICHIA COLI  Final      Susceptibility   Escherichia coli - MIC*    AMPICILLIN >=32 RESISTANT Resistant     CEFAZOLIN <=4 SENSITIVE Sensitive     CEFEPIME <=0.12 SENSITIVE Sensitive     CEFTAZIDIME <=1 SENSITIVE Sensitive     CEFTRIAXONE <=0.25 SENSITIVE Sensitive     CIPROFLOXACIN 0.5 SENSITIVE Sensitive     GENTAMICIN <=1 SENSITIVE Sensitive     IMIPENEM <=0.25 SENSITIVE Sensitive     TRIMETH/SULFA >=320 RESISTANT Resistant     AMPICILLIN/SULBACTAM >=32 RESISTANT Resistant     PIP/TAZO <=4 SENSITIVE Sensitive     * RARE ESCHERICHIA COLI  Aerobic/Anaerobic Culture (surgical/deep wound)     Status: None (Preliminary result)   Collection Time: 12/13/20  3:01 PM   Specimen: PATH Other; Tissue  Result Value Ref Range Status   Specimen Description TISSUE BONE  Final   Special Requests   Final    RT 3RD METATARSAL PREDEBRIDEMENT PT ON ANCEF CEFEPIME   Gram Stain NO WBC SEEN NO ORGANISMS SEEN   Final   Culture   Final    NO GROWTH < 24 HOURS Performed at [facility], [place] [address], [place], [place] [address]    Report Status PENDING  Incomplete  Aerobic/Anaerobic Culture (surgical/deep wound)     Status: None (Preliminary result)   Collection Time: 12/13/20  3:07 PM   Specimen:  PATH Other; Tissue  Result Value Ref Range Status   Specimen Description BONE  Final   Special Requests   Final    RT 3RD METATARSAL PREDEBRIDEMENT PT ON ANCEF,CEFEPIME   Gram Stain NO WBC SEEN NO ORGANISMS SEEN   Final   Culture   Final    NO GROWTH < 24 HOURS Performed at [facility], [place] [address] [place], [place] [address]    Report Status PENDING  Incomplete     Terri Piedra, NP [facility] Infectious [facility]  12/14/2020  11:08 AM

## 2020-12-14 NOTE — Evaluation (Signed)
Physical Therapy Evaluation Patient Details Name: Benjamin Russo MRN: [id number] DOB: December 24, 1976 Today's Date: 12/14/2020   History of Present Illness  44 yo male with past medical history of T2DM, HTN, tobacco abuse and peripheral vascular disease. Recent right foot 4th toe amputation due to gangrene about 2 weeks ago. For the last 7 days before admission developed discoloration and edema on his 5th toe right foot. s/p I&D 5th toe with wound vac appliaction.  Clinical Impression  Patient received in bed, reports he would like to go to the bathroom. Patient is mod independent with bed mobility. Educated on WB restrictions. Patient requires supervision for transfers. Patient is able to ambulate 200 feet with RW and PWB through heel on right LE. Cam boot donned. Ambulated up/down 4 steps with 1 or 2 rails.  He is slightly fatigued with ambulation.  Patient will continue to benefit from skilled PT while here to improve functional independence for return home.      Follow Up Recommendations No PT follow up    Equipment Recommendations  None recommended by PT    Recommendations for Other Services       Precautions / Restrictions Precautions Precaution Comments: mod fall Restrictions Weight Bearing Restrictions: Yes RLE Weight Bearing: Partial weight bearing RLE Partial Weight Bearing Percentage or Pounds: through heel Other Position/Activity Restrictions: Cam boot      Mobility  Bed Mobility Overal bed mobility: Independent                  Transfers Overall transfer level: Modified independent Equipment used: Rolling walker (2 wheeled)                Ambulation/Gait Ambulation/Gait assistance: Min guard Gait Distance (Feet): 200 Feet Assistive device: Rolling walker (2 wheeled) Gait Pattern/deviations: Step-to pattern;Decreased weight shift to left Gait velocity: decr   General Gait Details: steady with use of RW, ambulating with antalgic pattern due to [facility] on  right.  Stairs Stairs: Yes Stairs assistance: Supervision Stair Management: One rail Left;Step to pattern;Alternating pattern Number of Stairs: 4    Wheelchair Mobility    Modified Rankin (Stroke Patients Only)       Balance Overall balance assessment: Needs assistance Sitting-balance support: Feet supported Sitting balance-Leahy Scale: Good     Standing balance support: Bilateral upper extremity supported;During functional activity Standing balance-Leahy Scale: Good Standing balance comment: reliant on AD for WB restrictions                             Pertinent Vitals/Pain Pain Assessment: Faces Faces Pain Scale: Hurts a little bit Pain Location: R foot Pain Descriptors / Indicators: Discomfort;Sore Pain Intervention(s): Limited activity within patient's tolerance;Monitored during session;Repositioned    Home Living Family/patient expects to be discharged to:: Private residence Living Arrangements: Spouse/significant other;Children Available Help at Discharge: Family;Available PRN/intermittently Type of Home: House Home Access: Stairs to enter Entrance Stairs-Rails: Left Entrance Stairs-Number of Steps: 2 Home Layout: One level Home Equipment: Crutches      Prior Function Level of Independence: Independent with assistive device(s)               Hand Dominance        Extremity/Trunk Assessment   Upper Extremity Assessment Upper Extremity Assessment: Overall WFL for tasks assessed    Lower Extremity Assessment Lower Extremity Assessment: RLE deficits/detail RLE Sensation: decreased light touch    Cervical / Trunk Assessment Cervical / Trunk Assessment: Normal  Communication  Communication: No difficulties  Cognition Arousal/Alertness: Awake/alert Behavior During Therapy: WFL for tasks assessed/performed Overall Cognitive Status: Within Functional Limits for tasks assessed                                         General Comments      Exercises     Assessment/Plan    PT Assessment Patient needs continued PT services  PT Problem List Decreased mobility;Decreased activity tolerance;Pain       PT Treatment Interventions Therapeutic activities;Functional mobility training;Gait training;Patient/family education    PT Goals (Current goals can be found in the Care Plan section)  Acute Rehab PT Goals Patient Stated Goal: to return home PT Goal Formulation: With patient Time For Goal Achievement: 12/21/20 Potential to Achieve Goals: Good    Frequency Min 3X/week   Barriers to discharge        Co-evaluation               AM-PAC PT "6 Clicks" Mobility  Outcome Measure Help needed turning from your back to your side while in a flat bed without using bedrails?: None Help needed moving from lying on your back to sitting on the side of a flat bed without using bedrails?: None Help needed moving to and from a bed to a chair (including a wheelchair)?: A Little Help needed standing up from a chair using your arms (e.g., wheelchair or bedside chair)?: A Little Help needed to walk in hospital room?: A Little Help needed climbing 3-5 steps with a railing? : A Little 6 Click Score: 20    End of Session Equipment Utilized During Treatment: Other (comment) (walking boot on right foot)   Patient left: in chair Nurse Communication: Mobility status;Other (comment) (patient reports he feels constipated, RN notified) PT Visit Diagnosis: Difficulty in walking, not elsewhere classified (R26.2);Pain Pain - Right/Left: Right Pain - part of body: Ankle and joints of foot    Time: 1530-1550 PT Time Calculation (min) (ACUTE ONLY): 20 min   Charges:   PT Evaluation $PT Eval Moderate Complexity: 1 Mod PT Treatments $Gait Training: 8-22 mins        [employer], PT, GCS 12/14/20,4:02 PM

## 2020-12-14 NOTE — Progress Notes (Signed)
Attempted to round on patient. Patient was not in room. OK with discharge tomorrow. Leave dressing intact until follow up.

## 2020-12-14 NOTE — Plan of Care (Signed)

## 2020-12-15 ENCOUNTER — Other Ambulatory Visit (HOSPITAL_COMMUNITY): Payer: Self-pay | Admitting: Internal Medicine

## 2020-12-15 LAB — GLUCOSE, CAPILLARY
Glucose-Capillary: 147 mg/dL — ABNORMAL HIGH (ref 70–99)
Glucose-Capillary: 101 mg/dL — ABNORMAL HIGH (ref 70–99)
Glucose-Capillary: 160 mg/dL — ABNORMAL HIGH (ref 70–99)
Glucose-Capillary: 114 mg/dL — ABNORMAL HIGH (ref 70–99)

## 2020-12-15 MED ORDER — JUVEN PO PACK: 1.0000 | PACK | Freq: Two times a day (BID) | ORAL | 0 refills | Status: AC

## 2020-12-15 MED ORDER — SODIUM CHLORIDE 0.9% FLUSH: 10.0000 mL | INTRAVENOUS | Status: DC | PRN

## 2020-12-15 MED ORDER — METFORMIN HCL 500 MG PO TABS: 500.0000 mg | ORAL_TABLET | Freq: Two times a day (BID) | ORAL | 0 refills | Status: DC

## 2020-12-15 MED ORDER — ENSURE MAX PROTEIN PO LIQD: 11.0000 [oz_av] | Freq: Every day | ORAL | 0 refills | Status: AC

## 2020-12-15 MED ORDER — CEFAZOLIN IV (FOR PTA / DISCHARGE USE ONLY): 2.0000 g | Freq: Three times a day (TID) | INTRAVENOUS | 0 refills | Status: AC

## 2020-12-15 MED ORDER — POLYETHYLENE GLYCOL 3350 17 G PO PACK: 17.0000 g | PACK | Freq: Every day | ORAL | 0 refills | Status: AC | PRN

## 2020-12-15 MED ORDER — SODIUM CHLORIDE 0.9% FLUSH
10.0000 mL | Freq: Two times a day (BID) | INTRAVENOUS | Status: DC
  Administered 2020-12-15: 13:00:00 10 mL

## 2020-12-15 MED ORDER — CHLORHEXIDINE GLUCONATE CLOTH 2 % EX PADS
6.0000 | MEDICATED_PAD | Freq: Every day | CUTANEOUS | Status: DC
  Administered 2020-12-15: 13:00:00 6 via TOPICAL

## 2020-12-15 MED ORDER — HEPARIN SOD (PORK) LOCK FLUSH 100 UNIT/ML IV SOLN
250.0000 [IU] | INTRAVENOUS | Status: AC | PRN
  Administered 2020-12-15: 17:00:00 250 [IU]
  Filled 2020-12-15: qty 2.5

## 2020-12-15 MED ORDER — ADULT MULTIVITAMIN W/MINERALS CH: 1.0000 | ORAL_TABLET | Freq: Every day | ORAL | 0 refills | Status: AC

## 2020-12-15 MED ORDER — ASPIRIN EC 81 MG PO TBEC: 81.0000 mg | DELAYED_RELEASE_TABLET | Freq: Every day | ORAL | 2 refills | Status: AC

## 2020-12-15 MED ORDER — ACETAMINOPHEN 500 MG PO TABS: 500.0000 mg | ORAL_TABLET | Freq: Three times a day (TID) | ORAL | 0 refills | Status: DC | PRN

## 2020-12-15 MED ORDER — IBUPROFEN 400 MG PO TABS: 400.0000 mg | ORAL_TABLET | Freq: Four times a day (QID) | ORAL | 0 refills | Status: DC | PRN

## 2020-12-15 MED FILL — metFORMIN HCL 500 MG TABS: 500 | 30 days supply | Qty: 60 | Fill #0

## 2020-12-15 MED FILL — POLYETHYLENE GLYCOL 3350 PO: 17 | 30 days supply | Qty: 510 | Fill #0

## 2020-12-15 MED FILL — IBUPROFEN 400 MG TABS: 400 | 7 days supply | Qty: 30 | Fill #0

## 2020-12-15 MED FILL — ASPIRIN LOW DOSE 81 MG TBEC: 81 | 30 days supply | Qty: 30 | Fill #0

## 2020-12-15 MED FILL — ACETAMINOPHEN 500MG XT STRE: 500 | 17 days supply | Qty: 100 | Fill #0

## 2020-12-15 NOTE — Plan of Care (Signed)

## 2020-12-15 NOTE — Evaluation (Signed)
Occupational Therapy Evaluation Patient Details Name: Benjamin Russo MRN: [id number] DOB: [birth year] Today's Date: 12/15/2020    History of Present Illness 44 yo male with past medical history of T2DM, HTN, tobacco abuse and peripheral vascular disease. Recent right foot 4th toe amputation due to gangrene about 2 weeks ago. For the last 7 days before admission developed discoloration and edema on his 5th toe right foot. s/p I&D 5th toe with wound vac appliaction.   Clinical Impression   Pt presents with no complaints of pain, motivated to mobilize and return home later today. After initial education, pt able to don cam boot sitting EOB Independently. Pt overall Modified Independent for transfers, mobility in room/hallway with RW. No LOB or safety concerns and good maintenance of WB through heel. Educated on safety with tub transfers, performing LB ADLs seated and tips for IADL completion. Pt considering RW vs crutches for mobility at home. Pt's wife works for [employer] and will be on winter break starting today - able to assist pt as needed at home. No further skilled OT services indicated at acute level or on discharge.     Follow Up Recommendations  No OT follow up;Supervision - Intermittent    Equipment Recommendations  Other (comment) (RW)    Recommendations for Other Services       Precautions / Restrictions Precautions Precautions: Fall Required Braces or Orthoses: Other Brace Other Brace: cam boot Restrictions Weight Bearing Restrictions: Yes RLE Weight Bearing: Partial weight bearing RLE Partial Weight Bearing Percentage or Pounds: WB through heel Other Position/Activity Restrictions: Cam boot      Mobility Bed Mobility Overal bed mobility: Independent                  Transfers Overall transfer level: Modified independent Equipment used: Rolling walker (2 wheeled)             General transfer comment: Modified Independent with sit to  stand and mobility with RW in room/hallway    Balance Overall balance assessment: Needs assistance Sitting-balance support: Feet supported Sitting balance-Leahy Scale: Normal     Standing balance support: Bilateral upper extremity supported;During functional activity;Single extremity supported Standing balance-Leahy Scale: Good Standing balance comment: reliant on AD for WB restrictions                           ADL either performed or assessed with clinical judgement   ADL Overall ADL's : Modified independent                                       General ADL Comments: Modified Independent with donning cam boot after initial education, able to manage R sock/shoe, stand and walk in room/hallway with walker and no physical assist. No LOB or safety concerns     Vision Baseline Vision/History: No visual deficits Patient Visual Report: No change from baseline Vision Assessment?: No apparent visual deficits     Perception     Praxis      Pertinent Vitals/Pain Pain Assessment: No/denies pain     Hand Dominance Right   Extremity/Trunk Assessment Upper Extremity Assessment Upper Extremity Assessment: Overall WFL for tasks assessed   Lower Extremity Assessment Lower Extremity Assessment: Defer to PT evaluation   Cervical / Trunk Assessment Cervical / Trunk Assessment: Normal   Communication Communication Communication: No difficulties   Cognition Arousal/Alertness: Awake/alert Behavior  During Therapy: WFL for tasks assessed/performed Overall Cognitive Status: Within Functional Limits for tasks assessed                                     General Comments  Educated on tub transfer safety, recommendation to confirm with doctor when to shower, use of folding chair as long as it has rubber feet on it and will not slip. Provided education on tips for IADL safety    Exercises     Shoulder Instructions      Home Living  Family/patient expects to be discharged to:: Private residence Living Arrangements: Spouse/significant other;Children Available Help at Discharge: Family;Available PRN/intermittently Type of Home: House Home Access: Stairs to enter [employer] of Steps: 2 Entrance Stairs-Rails: Left Home Layout: One level     Bathroom Shower/Tub: Chief Strategy Officer: Standard     Home Equipment: Crutches;Other (comment) (a folding chair with rubber feet as shower chair)   Additional Comments: Pt's wife works for [employer], on winter break after today and will be home to assist pt as needed      Prior Functioning/Environment Level of Independence: Independent with assistive device(s)                 OT Problem List:        OT Treatment/Interventions:      OT Goals(Current goals can be found in the care plan section) Acute Rehab OT Goals Patient Stated Goal: to return home OT Goal Formulation: All assessment and education complete, DC therapy  OT Frequency:     Barriers to D/C:            Co-evaluation              AM-PAC OT "6 Clicks" Daily Activity     Outcome Measure Help from another person eating meals?: None Help from another person taking care of personal grooming?: None Help from another person toileting, which includes using toliet, bedpan, or urinal?: None Help from another person bathing (including washing, rinsing, drying)?: None Help from another person to put on and taking off regular upper body clothing?: None Help from another person to put on and taking off regular lower body clothing?: None 6 Click Score: 24   End of Session Equipment Utilized During Treatment: Rolling walker;Other (comment) (cam boot) Nurse Communication: Mobility status  Activity Tolerance: Patient tolerated treatment well Patient left: in bed;with call bell/phone within reach  OT Visit Diagnosis: Other abnormalities of gait and mobility  (R26.89)                Time: 8756-4332 OT Time Calculation (min): 23 min Charges:  OT General Charges $OT Visit: 1 Visit OT Evaluation $OT Eval Low Complexity: 1 Low  Lorre Munroe, OTR/L  Lorre Munroe 12/15/2020, 11:15 AM

## 2020-12-15 NOTE — Progress Notes (Signed)
Provided discharge education/instructions, all questions and concerns addressed, Pt not in distress, switched to prevena wound vac, dressing clean, dry and intact, no error noted. PICC capped by IV team. Pt to discharge home accompanied by family.

## 2020-12-15 NOTE — Discharge Summary (Signed)
Physician Discharge Summary  Jossiah Smoak EHM:[id number] DOB: April 20, 1976 DOA: 12/04/2020  PCP: Maryella Shivers, MD  Admit date: 12/04/2020 Discharge date: 12/15/2020  Admitted From: Home  Disposition:  Home   Recommendations for Outpatient Follow-up and new medication changes:  1. Follow up with Dr. Nyra Capes in 7 days.  2. Continue antibiotic therapy with IV cefazolin and oral fluconazole.  3. Follow up with iron stores in 2 to 3 weeks, may need outpatient colonoscopy.  4.   Follow up with ID Dr. Tommy Medal on 01/19/21 2:30 pm.  5.  Follow up with Dr March Rummage from Podiatry as outpatient.   Discharge antibiotics to be given via PICC line Discharge antibiotics: Cefazolin  Per pharmacy protocol   Duration: 6 weeks   End Date: 01/24/21  [facility] Care Per Protocol:  Home health RN for IV administration and teaching; PICC line care and labs.   Labs weekly while on IV antibiotics: _X_ CBC with differential _X_ CMP _X_ CRP _X_ ESR  _X_ Please pull PIC at completion of IV antibiotics  Fax weekly labs to [phone]     Home Health: yes wound care   Equipment/Devices: wound vac and boot.    Discharge Condition: stable  CODE STATUS: full  Diet recommendation: hearth healthy and diabetic prudent   Brief/Interim Summary: Mrs. Dorthy Cooler was admitted to the hospital with the working diagnosis of osteomyelitis and gangrene of right3 toe, and right 3 to 5 metatarsal.   44 yo male with past medical history of T2DM, HTN, tobacco abuse and peripheral vascular disease. Recent right foot 4th toe amputation due to gangrene about 2 weeks ago. For the last 7 days before admission developed discoloration and edema on his 5th toe right foot. He was evaluated at Dr. March Rummage office (podiatry) and was referred to hospitalization for further management. Patient reported fever, chills along of right foot edema and wound drainage. On his initial physical examination blood pressure 137/80, HR 88, RR  17, Temp 98.3 and 02 sat 99%, his lungs were clear to auscultation, heart s1 and s2 present, abdomen soft. Right foot with erythema, edema and signs of gangrene at the 5th toe.   Na 135, K 4,6, Cl 104, bicarb 22, glucose 172, wbc 10, Hgb 8,7, hematocrit 25.5, platelets 323.  SARS COVID-19 negative.  He was placed on broad spectrum antibiotic therapy(vancomycin, cefepime, eraxis)and obtained ABI, showing moderately depressed toe pressures, consistent with small vessel disease not candidate for endovascular or open vascular intervention.   Right foot MRI concerning for osteomyelitis third toe, third to fifth metatarsal bones and diffuse soft tissue inflammation.   Patient underwent I&D on 12/09 fifth ray partial amputation and metatarsectomy fourth partial metatarsal.  12/11, third metatarsal head resection, debridement and delay closure.   Surgical cultures positive forCandida Albicans and E. Coli.  12/15 patient underwent incision and drainage of right foot and wound vac application.   Plan to continue IV antibiotic therapy for a total of 6 weeks with ancef and oral fluconazole for 6 months.   1.  Osteomyelitis, right foot, third toe, third to fourth metatarsal.  Surgical cultures positive for E. coli and Candida.  Infection disease was consulted.  After multiple debridements, recommendation is to continue intravenous antibiotic therapy with cefazolin till 01/24/2021 and oral fluconazole for 6 months.  His pain is well controlled, he has a wound VAC in place.  Follow-up with infectious disease and podiatry as an outpatient.  2.  Hypertension/peripheral vascular disease.  Continue blood pressure control with  metoprolol.  3.  Type 2 diabetes mellitus/ dyslipidemia.  Patient required insulin therapy during his hospitalization, basal, Premeal and sliding scale, for glucose control.  At discharge patient will resume Metformin and basal insulin with glargine.  Lipid panel with  Cholesterol 165, HDL 26, LDL 117, Triglycerides 108, follow as outpatient.   4.  Chronic anemia, anemia of iron deficiency.  His iron panel showed serum iron 22, TIBC 232, transferrin saturation of 9 and ferritin 436. Patient received 2 doses of IV ferrous gluconate, follow-up as an outpatient with iron stores in 2 to 3 weeks. Patient has been avoiding meat for the last several weeks, he was advised to increase his oral iron intake, considering his age he also qualify for a screening colonoscopy.  5.  Hyponatremia.  Transitory, improved with supportive medical therapy.  6.  Overweight.  Calculated BMI 27.6, follow-up as an outpatient.   Discharge Diagnoses:  Principal Problem:   Acute osteomyelitis of toe, right ([place]) Active Problems:   Gangrene ([place])   Hypertension   Type 2 diabetes mellitus (HCC)   Cellulitis of right foot   Ulcer of right foot, with necrosis of bone (HCC)   Post-op pain   Post-operative state    Discharge Instructions   Allergies as of 12/15/2020   No Known Allergies     Medication List    STOP taking these medications   doxycycline 100 MG tablet Commonly known as: VIBRA-TABS     TAKE these medications   acetaminophen 325 MG tablet Commonly known as: TYLENOL Take 650 mg by mouth every 6 (six) hours as needed for mild pain, fever or headache.   BD Pen Needle Nano 2nd Gen 32G X 4 MM Misc Generic drug: Insulin Pen Needle SMARTSIG:Injection As Directed   ceFAZolin  IVPB Commonly known as: ANCEF Inject 2 g into the vein every 8 (eight) hours. Indication:  Right foot osteomyelitis  First Dose: Yes Last Day of Therapy:  01/24/2021 Labs - Once weekly:  CBC/D and BMP, Labs - Every other week:  ESR and CRP Method of administration: IV Push Method of administration may be changed at the discretion of home infusion pharmacist based upon assessment of the patient and/or caregiver's ability to self-administer the medication ordered.   Ensure Max Protein  Liqd Take 330 mLs (11 oz total) by mouth at bedtime.   nutrition supplement (JUVEN) Pack Take 1 packet by mouth 2 (two) times daily between meals.   fluconazole 200 MG tablet Commonly known as: DIFLUCAN Take 2 tablets (400 mg total) by mouth daily.   FreeStyle [employer] 2 Sensor Misc See admin instructions.   ibuprofen 400 MG tablet Commonly known as: IBU Take 1 tablet (400 mg total) by mouth every 6 (six) hours as needed for moderate pain.   Lantus SoloStar 100 UNIT/ML Solostar Pen Generic drug: insulin glargine Inject 25 Units into the skin at bedtime.   metFORMIN 500 MG tablet Commonly known as: GLUCOPHAGE Take 1 tablet (500 mg total) by mouth 2 (two) times daily with a meal.   metoprolol succinate 25 MG 24 hr tablet Commonly known as: TOPROL-XL Take 25 mg by mouth daily.   multivitamin with minerals Tabs tablet Take 1 tablet by mouth daily.   polyethylene glycol 17 g packet Commonly known as: MIRALAX / GLYCOLAX Take 17 g by mouth daily as needed for moderate constipation.            Discharge Care Instructions  (From admission, onward)  Start     Ordered   12/15/20 0000  Change dressing on IV access line weekly and PRN  (Home infusion instructions - [facility] )        12/15/20 0958   12/15/20 0000  Discharge wound care:       Comments: [place] wound Vac, suction 125 mm/Hg, continuous.   12/15/20 0958          No Known Allergies  Consultations:  Podiatry   ID    Procedures/Studies: MR FOOT RIGHT W WO CONTRAST  Result Date: 12/06/2020 CLINICAL DATA:  Diabetic foot wound. History of fourth ray resection approximately 2 weeks ago. EXAM: MRI OF THE RIGHT FOREFOOT WITHOUT AND WITH CONTRAST TECHNIQUE: Multiplanar, multisequence MR imaging of the right forefoot was performed before and after the administration of intravenous contrast. CONTRAST:  8.91m GADAVIST GADOBUTROL 1 MMOL/ML IV SOLN COMPARISON:  X-ray 12/04/2020 FINDINGS:  Bones/Joint/Cartilage Patient is status post fourth ray resection at the level of the fourth metatarsal neck. There is prominent marrow edema throughout the fourth metatarsal diaphysis with patchy low T1 marrow signal concerning for osteomyelitis. Marrow edema and enhancement with patchy intermediate T1 marrow signal throughout the fifth metacarpal head and diaphysis extending to the level of the proximal metaphysis. Abnormal marrow signal within the proximal phalanx of the third toe as well as within the third metatarsal head extending proximally to the level of the mid diaphysis. There is also patchy marrow edema within the first metatarsal diaphysis and first metatarsal neck. Mild marrow edema within the medial and intermediate cuneiform. No acute fracture. No dislocation. No joint effusion. Ligaments Intact Lisfranc ligament.  No evidence of acute ligamentous injury. Muscles and Tendons Diffuse edema-like signal throughout the intrinsic foot musculature suggesting a nonspecific myositis. Amputation changes to the fourth toe flexor and extensor tendons. Remaining tendinous structures are intact. No tenosynovitis. Soft tissues Soft tissue defect from fourth ray resection. There is extensive soft tissue edema. Large area of soft tissue non enhancement at the lateral aspect of the forefoot distally involving the site of amputation and laterally to the level of the fifth metatarsal neck (series 10, image 20) concerning for tissue necrosis. No organized soft tissue fluid collection or abscess. IMPRESSION: 1. Postsurgical changes from fourth ray resection at the level of the fourth metatarsal neck. Bone marrow signal changes within the residual fourth metatarsal diaphysis are concerning for osteomyelitis. 2. Bone marrow signal changes throughout the fifth metatarsal, third toe proximal phalanx, and distal aspect of the third metatarsal are also suspicious for osteomyelitis. 3. Patchy marrow edema within the first  metatarsal diaphysis and first metatarsal neck. This may be reactive or reflect stress related changes. 4. Diffuse edema-like signal throughout the intrinsic foot musculature suggesting a nonspecific myositis. 5. Large area of soft tissue non-enhancement at the lateral aspect of the forefoot distally involving the site of amputation and laterally to the level of the fifth metatarsal neck concerning for tissue necrosis. Electronically Signed   By: NDavina PokeD.O.   On: 12/06/2020 15:35   DG Foot 2 Views Right  Result Date: 12/13/2020 CLINICAL DATA:  Status post amputation EXAM: RIGHT FOOT - 2 VIEW COMPARISON:  12/09/2020 FINDINGS: Surgical staples and wound VAC over the lateral aspect of the right foot. Status post mid metatarsal amputation of the third, fourth and fifth rays. The third digit phalanges remain. No acute abnormality. Extensive vascular calcification. Small ulceration of the distal great toe, unchanged. IMPRESSION: Status post mid metatarsal amputation of the right foot.  Electronically Signed   By: Ulyses Jarred M.D.   On: 12/13/2020 19:21   DG Foot 2 Views Right  Result Date: 12/09/2020 CLINICAL DATA:  Ulcer, osteomyelitis, toe amputations EXAM: RIGHT FOOT - 2 VIEW COMPARISON:  12/07/2020 FINDINGS: Additional resection of the right third metatarsal head. Previous amputations of the fourth and fifth toes back to the proximal metatarsal level. Overlying skin staples noted and dressing/wound VAC. Overall stable alignment. Peripheral vascular calcification on lateral view. Soft tissue swelling evident. IMPRESSION: Postop changes as above. No unexpected finding by plain radiography. Electronically Signed   By: Jerilynn Mages.  Shick M.D.   On: 12/09/2020 13:50   DG Foot 2 Views Right  Result Date: 12/07/2020 CLINICAL DATA:  Postop. EXAM: RIGHT FOOT - 2 VIEW COMPARISON:  December 04, 2020 FINDINGS: The patient has undergone transmetatarsal amputation of the fourth and fifth digits. There are expected  postsurgical changes including subcutaneous gas and overlying soft tissue edema. There is soft tissue swelling about the first digit with a questionable ulceration. Vascular calcifications are noted. IMPRESSION: 1. Status post transmetatarsal amputation of the fourth and fifth digits. 2. Soft tissue swelling about the first digit with a questionable ulceration. Electronically Signed   By: Constance Holster M.D.   On: 12/07/2020 20:04   DG Foot Complete Right  Result Date: 12/04/2020 Please see detailed radiograph report in office note.  VAS [place] ABI WITH/WO TBI  Result Date: 12/06/2020 LOWER EXTREMITY DOPPLER STUDY Indications: Ulceration, and gangrene. High Risk Factors: Hypertension, Diabetes, current smoker.  Limitations: Today's exam was limited due to Irregular heart beat. Comparison Study: No prior study Performing Technologist: Sharion Dove RVS  Examination Guidelines: A complete evaluation includes at minimum, Doppler waveform signals and systolic blood pressure reading at the level of bilateral brachial, anterior tibial, and posterior tibial arteries, when vessel segments are accessible. Bilateral testing is considered an integral part of a complete examination. Photoelectric Plethysmograph (PPG) waveforms and toe systolic pressure readings are included as required and additional duplex testing as needed. Limited examinations for reoccurring indications may be performed as noted.  ABI Findings: +---------+------------------+-----+-----------+--------+ Right    Rt Pressure (mmHg)IndexWaveform   Comment  +---------+------------------+-----+-----------+--------+ Brachial 136                    multiphasic         +---------+------------------+-----+-----------+--------+ PTA      254               1.87 multiphasic         +---------+------------------+-----+-----------+--------+ DP       254               1.87 multiphasic          +---------+------------------+-----+-----------+--------+ Great Toe47                0.35                     +---------+------------------+-----+-----------+--------+ +---------+------------------+-----+-----------+-------+ Left     Lt Pressure (mmHg)IndexWaveform   Comment +---------+------------------+-----+-----------+-------+ Brachial 132                    multiphasic        +---------+------------------+-----+-----------+-------+ PTA      254               1.87 multiphasic        +---------+------------------+-----+-----------+-------+ DP       254  1.87 multiphasic        +---------+------------------+-----+-----------+-------+ Great Toe57                0.42                    +---------+------------------+-----+-----------+-------+ +-------+-----------+-----------+------------+------------+ ABI/TBIToday's ABIToday's TBIPrevious ABIPrevious TBI +-------+-----------+-----------+------------+------------+ Right  1.87       0.35                                +-------+-----------+-----------+------------+------------+ Left   1.87       0.42                                +-------+-----------+-----------+------------+------------+ Arterial wall calcification precludes accurate ankle pressures and ABIs.  Summary: Right: Resting right ankle-brachial index indicates noncompressible right lower extremity arteries. The right toe-brachial index is abnormal. Left: Resting left ankle-brachial index indicates noncompressible left lower extremity arteries. The left toe-brachial index is abnormal.  *See table(s) above for measurements and observations.  Electronically signed by Servando Snare MD on 12/06/2020 at 3:09:09 PM.    Final    [place] EKG SITE RITE  Result Date: 12/14/2020 If Site Rite image not attached, placement could not be confirmed due to current cardiac rhythm.     Procedures:  Patient underwent I&D on 12/09 fifth ray partial amputation  and metatarsectomy fourth partial metatarsal.  12/11, third metatarsal head resection, debridement and delay closure. 12/15 I&D and wound vac application   Subjective: Patient is feeling well, no nausea or vomiting, no significant wound pain, no chest pain or dyspnea.   Discharge Exam: Vitals:   12/15/20 0406 12/15/20 0742  BP: 139/74 129/80  Pulse: 76 75  Resp: 14 16  Temp: 97.8 F (36.6 C) 98 F (36.7 C)  SpO2: 100% 98%   Vitals:   12/14/20 1406 12/14/20 1956 12/15/20 0406 12/15/20 0742  BP: 136/70 129/67 139/74 129/80  Pulse: 77 83 76 75  Resp: $Remo'17 17 14 16  'FmjhI$ Temp: 98.4 F (36.9 C) 98.5 F (36.9 C) 97.8 F (36.6 C) 98 F (36.7 C)  TempSrc: Oral Oral Oral Oral  SpO2: 100% 100% 100% 98%  Weight:      Height:        General: Not in pain or dyspnea,.  Neurology: Awake and alert, non focal  E ENT: no pallor, no icterus, oral mucosa moist Cardiovascular: No JVD. S1-S2 present, rhythmic, no gallops, rubs, or murmurs. No lower extremity edema. Pulmonary: positive breath sounds bilaterally, adequate air movement, no wheezing, rhonchi or rales. Gastrointestinal. Abdomen soft and non tender Skin. No rashes Musculoskeletal: right foot with dressing and wound vac in place.    The results of significant diagnostics from this hospitalization (including imaging, microbiology, ancillary and laboratory) are listed below for reference.     Microbiology: Recent Results (from the past 240 hour(s))  Resp Panel by RT-PCR (Flu A&B, Covid) Nasopharyngeal Swab     Status: None   Collection Time: 12/06/20  8:13 AM   Specimen: Nasopharyngeal Swab; Nasopharyngeal(NP) swabs in vial transport medium  Result Value Ref Range Status   SARS Coronavirus 2 by RT PCR NEGATIVE NEGATIVE Final    Comment: (NOTE) SARS-CoV-2 target nucleic acids are NOT DETECTED.  The SARS-CoV-2 RNA is generally detectable in upper respiratory specimens during the acute phase of infection. The lowest concentration  of SARS-CoV-2 viral copies this  assay can detect is 138 copies/mL. A negative result does not preclude SARS-Cov-2 infection and should not be used as the sole basis for treatment or other patient management decisions. A negative result may occur with  improper specimen collection/handling, submission of specimen other than nasopharyngeal swab, presence of viral mutation(s) within the areas targeted by this assay, and inadequate number of viral copies(<138 copies/mL). A negative result must be combined with clinical observations, patient history, and epidemiological information. The expected result is Negative.  Fact Sheet for Patients:  EntrepreneurPulse.com.au  Fact Sheet for Healthcare Providers:  IncredibleEmployment.be  This test is no t yet approved or cleared by the [place] FDA and  has been authorized for detection and/or diagnosis of SARS-CoV-2 by FDA under an Emergency Use Authorization (EUA). This EUA will remain  in effect (meaning this test can be used) for the duration of the COVID-19 declaration under Section 564(b)(1) of the Act, 21 U.S.C.section 360bbb-3(b)(1), unless the authorization is terminated  or revoked sooner.       Influenza A by PCR NEGATIVE NEGATIVE Final   Influenza B by PCR NEGATIVE NEGATIVE Final    Comment: (NOTE) The Xpert Xpress SARS-CoV-2/FLU/RSV plus assay is intended as an aid in the diagnosis of influenza from Nasopharyngeal swab specimens and should not be used as a sole basis for treatment. Nasal washings and aspirates are unacceptable for Xpert Xpress SARS-CoV-2/FLU/RSV testing.  Fact Sheet for Patients: EntrepreneurPulse.com.au  Fact Sheet for Healthcare Providers: IncredibleEmployment.be  This test is not yet approved or cleared by the [place] FDA and has been authorized for detection and/or diagnosis of SARS-CoV-2 by FDA under an Emergency Use  Authorization (EUA). This EUA will remain in effect (meaning this test can be used) for the duration of the COVID-19 declaration under Section 564(b)(1) of the Act, 21 U.S.C. section 360bbb-3(b)(1), unless the authorization is terminated or revoked.  Performed at [facility], [place] [address], [place], [place] [address]   Aerobic/Anaerobic Culture (surgical/deep wound)     Status: None (Preliminary result)   Collection Time: 12/09/20 10:04 AM   Specimen: Bone; Tissue  Result Value Ref Range Status   Specimen Description BONE  Final   Special Requests NONE  Final   Gram Stain NO WBC SEEN NO ORGANISMS SEEN   Final   Culture   Final    RARE CANDIDA ALBICANS RARE ESCHERICHIA COLI NO ANAEROBES ISOLATED Performed at [facility], [place] [address], [place], [place] [address]    Report Status PENDING  Incomplete   Organism ID, Bacteria ESCHERICHIA COLI  Final      Susceptibility   Escherichia coli - MIC*    AMPICILLIN >=32 RESISTANT Resistant     CEFAZOLIN <=4 SENSITIVE Sensitive     CEFEPIME <=0.12 SENSITIVE Sensitive     CEFTAZIDIME <=1 SENSITIVE Sensitive     CEFTRIAXONE <=0.25 SENSITIVE Sensitive     CIPROFLOXACIN 0.5 SENSITIVE Sensitive     GENTAMICIN <=1 SENSITIVE Sensitive     IMIPENEM <=0.25 SENSITIVE Sensitive     TRIMETH/SULFA >=320 RESISTANT Resistant     AMPICILLIN/SULBACTAM >=32 RESISTANT Resistant     PIP/TAZO <=4 SENSITIVE Sensitive     * RARE ESCHERICHIA COLI  Antifungal AST 9 Drug Panel     Status: None (Preliminary result)   Collection Time: 12/09/20 10:04 AM  Result Value Ref Range Status   Organism ID, Yeast Preliminary report  Final    Comment: (NOTE) Specimen has been received and testing has been initiated.  Performed At: [facility] [place], [place] [id number] Rush Farmer MD [id number]    Amphotericin B MIC PENDING  Incomplete   Please Note: PENDING  Incomplete   Anidulafungin MIC PENDING  Incomplete    Caspofungin MIC PENDING  Incomplete   Micafungin MIC PENDING  Incomplete   Posaconazole MIC PENDING  Incomplete   Please Note: PENDING  Incomplete   Fluconazole Islt MIC PENDING  Incomplete   Flucytosine MIC PENDING  Incomplete   Itraconazole MIC PENDING  Incomplete   Voriconazole MIC PENDING  Incomplete   Source CANDIDA ALBICANS/BONE RIGHT FOOT  Final    Comment: Performed at [facility], [place]. [address] [place], [place] [address]  Aerobic/Anaerobic Culture (surgical/deep wound)     Status: None   Collection Time: 12/09/20 10:06 AM   Specimen: Foot, Right; Wound  Result Value Ref Range Status   Specimen Description FOOT  Final   Special Requests RIGHT  Final   Gram Stain NO WBC SEEN NO ORGANISMS SEEN   Final   Culture   Final    FEW CANDIDA ALBICANS RARE ESCHERICHIA COLI NO ANAEROBES ISOLATED Performed at [facility], [address] [place], [place] [address]    Report Status 12/14/2020 FINAL  Final   Organism ID, Bacteria ESCHERICHIA COLI  Final      Susceptibility   Escherichia coli - MIC*    AMPICILLIN >=32 RESISTANT Resistant     CEFAZOLIN <=4 SENSITIVE Sensitive     CEFEPIME <=0.12 SENSITIVE Sensitive     CEFTAZIDIME <=1 SENSITIVE Sensitive     CEFTRIAXONE <=0.25 SENSITIVE Sensitive     CIPROFLOXACIN 0.5 SENSITIVE Sensitive     GENTAMICIN <=1 SENSITIVE Sensitive     IMIPENEM <=0.25 SENSITIVE Sensitive     TRIMETH/SULFA >=320 RESISTANT Resistant     AMPICILLIN/SULBACTAM >=32 RESISTANT Resistant     PIP/TAZO <=4 SENSITIVE Sensitive     * RARE ESCHERICHIA COLI  Aerobic/Anaerobic Culture (surgical/deep wound)     Status: None (Preliminary result)   Collection Time: 12/13/20  3:01 PM   Specimen: PATH Other; Tissue  Result Value Ref Range Status   Specimen Description TISSUE BONE  Final   Special Requests   Final    RT 3RD METATARSAL PREDEBRIDEMENT PT ON ANCEF CEFEPIME   Gram Stain NO WBC SEEN NO ORGANISMS SEEN   Final   Culture   Final    NO GROWTH  < 24 HOURS Performed at [facility], [place] [address], [place], [place] [address]    Report Status PENDING  Incomplete  Acid Fast Smear (AFB)     Status: None   Collection Time: 12/13/20  3:01 PM  Result Value Ref Range Status   AFB Specimen Processing Concentration  Final   Acid Fast Smear Negative  Final    Comment: (NOTE) Performed At: [facility] [place], [place] [id number] Rush Farmer MD ZM:[id number]    Source (AFB) TISSUE  Final    Comment: BONE RT 3RD METATARSAL PREDEBRIDEMENT Performed at [facility], [place] [address] [place], [place] [address]   Aerobic/Anaerobic Culture (surgical/deep wound)     Status: None (Preliminary result)   Collection Time: 12/13/20  3:07 PM   Specimen: PATH Other; Tissue  Result Value Ref Range Status   Specimen Description BONE  Final   Special Requests   Final    RT 3RD METATARSAL PREDEBRIDEMENT PT ON ANCEF,CEFEPIME   Gram Stain NO WBC SEEN NO  ORGANISMS SEEN   Final   Culture   Final    NO GROWTH < 24 HOURS Performed at [facility], [place] [address], [place], [place] [id number]    Report Status PENDING  Incomplete  Acid Fast Smear (AFB)     Status: None   Collection Time: 12/13/20  3:07 PM  Result Value Ref Range Status   AFB Specimen Processing Concentration  Final   Acid Fast Smear Negative  Final    Comment: (NOTE) Performed At: [facility] [place], [place] [id number] Rush Farmer MD [id number]    Source (AFB) BONE  Final    Comment: RT 3RD METATARSAL PREDERIDEMENT Performed at [facility], [place] [address] [place], [place] [address]      Labs: BNP (last 3 results) No results for input(s): BNP in the last 8760 hours. Basic Metabolic Panel: Recent Labs  Lab 12/09/20 0259 12/10/20 0450 12/13/20 0331 12/14/20 0323  NA 137 135 136 138  K 4.4 4.3 3.9 4.4  CL 106 103 103 103  CO2 _0 GLUCOSE 193* 159* 160* 173*  BUN 19 18 23* 19   CREATININE 0.98 1.03 1.05 0.93  CALCIUM 8.6* 8.8* 8.9 9.2  MG 1.9 1.7  --   --   PHOS 2.9 3.4  --   --    Liver Function Tests: Recent Labs  Lab 12/09/20 0259 12/10/20 0450 12/13/20 0331 12/14/20 0323  AST  --   --  30 33  ALT  --   --  42 41  ALKPHOS  --   --  68 75  BILITOT  --   --  0.5 0.2*  PROT  --   --  6.6 6.5  ALBUMIN 2.4* 2.4* 2.6* 2.6*   No results for input(s): LIPASE, AMYLASE in the last 168 hours. No results for input(s): AMMONIA in the last 168 hours. CBC: Recent Labs  Lab 12/09/20 0259 12/10/20 0450 12/11/20 0041 12/13/20 0331  WBC 11.6* 10.5 9.2 9.1  NEUTROABS  --  7.4  --   --   HGB 7.9* 7.9* 8.3* 8.3*  HCT 23.0* 24.9* 25.9* 25.4*  MCV 73.2* 74.6* 74.9* 74.5*  PLT 323 299 300 311   Cardiac Enzymes: No results for input(s): CKTOTAL, CKMB, CKMBINDEX, TROPONINI in the last 168 hours. BNP: Invalid input(s): POCBNP CBG: Recent Labs  Lab 12/14/20 1602 12/14/20 1958 12/14/20 2350 12/15/20 0407 12/15/20 0736  GLUCAP 136* 95 158* 147* 101*   D-Dimer No results for input(s): DDIMER in the last 72 hours. Hgb A1c No results for input(s): HGBA1C in the last 72 hours. Lipid Profile No results for input(s): CHOL, HDL, LDLCALC, TRIG, CHOLHDL, LDLDIRECT in the last 72 hours. Thyroid function studies No results for input(s): TSH, T4TOTAL, T3FREE, THYROIDAB in the last 72 hours.  Invalid input(s): FREET3 Anemia work up No results for input(s): VITAMINB12, FOLATE, FERRITIN, TIBC, IRON, RETICCTPCT in the last 72 hours. Urinalysis No results found for: COLORURINE, APPEARANCEUR, [place], [place], GLUCOSEU, [place], [place], [place], PROTEINUR, UROBILINOGEN, NITRITE, LEUKOCYTESUR Sepsis Labs Invalid input(s): PROCALCITONIN,  WBC,  LACTICIDVEN Microbiology Recent Results (from the past 240 hour(s))  Resp Panel by RT-PCR (Flu A&B, Covid) Nasopharyngeal Swab     Status: None   Collection Time: 12/06/20  8:13 AM   Specimen: Nasopharyngeal Swab;  Nasopharyngeal(NP) swabs in vial transport medium  Result Value Ref Range Status   SARS Coronavirus 2 by RT PCR NEGATIVE NEGATIVE Final    Comment: (NOTE)  SARS-CoV-2 target nucleic acids are NOT DETECTED.  The SARS-CoV-2 RNA is generally detectable in upper respiratory specimens during the acute phase of infection. The lowest concentration of SARS-CoV-2 viral copies this assay can detect is 138 copies/mL. A negative result does not preclude SARS-Cov-2 infection and should not be used as the sole basis for treatment or other patient management decisions. A negative result may occur with  improper specimen collection/handling, submission of specimen other than nasopharyngeal swab, presence of viral mutation(s) within the areas targeted by this assay, and inadequate number of viral copies(<138 copies/mL). A negative result must be combined with clinical observations, patient history, and epidemiological information. The expected result is Negative.  Fact Sheet for Patients:  EntrepreneurPulse.com.au  Fact Sheet for Healthcare Providers:  IncredibleEmployment.be  This test is no t yet approved or cleared by the [place] FDA and  has been authorized for detection and/or diagnosis of SARS-CoV-2 by FDA under an Emergency Use Authorization (EUA). This EUA will remain  in effect (meaning this test can be used) for the duration of the COVID-19 declaration under Section 564(b)(1) of the Act, 21 U.S.C.section 360bbb-3(b)(1), unless the authorization is terminated  or revoked sooner.       Influenza A by PCR NEGATIVE NEGATIVE Final   Influenza B by PCR NEGATIVE NEGATIVE Final    Comment: (NOTE) The Xpert Xpress SARS-CoV-2/FLU/RSV plus assay is intended as an aid in the diagnosis of influenza from Nasopharyngeal swab specimens and should not be used as a sole basis for treatment. Nasal washings and aspirates are unacceptable for Xpert Xpress  SARS-CoV-2/FLU/RSV testing.  Fact Sheet for Patients: EntrepreneurPulse.com.au  Fact Sheet for Healthcare Providers: IncredibleEmployment.be  This test is not yet approved or cleared by the [place] FDA and has been authorized for detection and/or diagnosis of SARS-CoV-2 by FDA under an Emergency Use Authorization (EUA). This EUA will remain in effect (meaning this test can be used) for the duration of the COVID-19 declaration under Section 564(b)(1) of the Act, 21 U.S.C. section 360bbb-3(b)(1), unless the authorization is terminated or revoked.  Performed at [facility], [place] [address], [place], [place] [address]   Aerobic/Anaerobic Culture (surgical/deep wound)     Status: None (Preliminary result)   Collection Time: 12/09/20 10:04 AM   Specimen: Bone; Tissue  Result Value Ref Range Status   Specimen Description BONE  Final   Special Requests NONE  Final   Gram Stain NO WBC SEEN NO ORGANISMS SEEN   Final   Culture   Final    RARE CANDIDA ALBICANS RARE ESCHERICHIA COLI NO ANAEROBES ISOLATED Performed at [facility], [place] [address], [place], [place] [address]    Report Status PENDING  Incomplete   Organism ID, Bacteria ESCHERICHIA COLI  Final      Susceptibility   Escherichia coli - MIC*    AMPICILLIN >=32 RESISTANT Resistant     CEFAZOLIN <=4 SENSITIVE Sensitive     CEFEPIME <=0.12 SENSITIVE Sensitive     CEFTAZIDIME <=1 SENSITIVE Sensitive     CEFTRIAXONE <=0.25 SENSITIVE Sensitive     CIPROFLOXACIN 0.5 SENSITIVE Sensitive     GENTAMICIN <=1 SENSITIVE Sensitive     IMIPENEM <=0.25 SENSITIVE Sensitive     TRIMETH/SULFA >=320 RESISTANT Resistant     AMPICILLIN/SULBACTAM >=32 RESISTANT Resistant     PIP/TAZO <=4 SENSITIVE Sensitive     * RARE ESCHERICHIA COLI  Antifungal AST 9 Drug Panel     Status: None (Preliminary result)   Collection Time:  12/09/20 10:04 AM  Result Value Ref Range Status   Organism ID,  Yeast Preliminary report  Final    Comment: (NOTE) Specimen has been received and testing has been initiated. Performed At: [facility] [place], [place] [id number] Rush Farmer MD [id number]    Amphotericin B MIC PENDING  Incomplete   Please Note: PENDING  Incomplete   Anidulafungin MIC PENDING  Incomplete   Caspofungin MIC PENDING  Incomplete   Micafungin MIC PENDING  Incomplete   Posaconazole MIC PENDING  Incomplete   Please Note: PENDING  Incomplete   Fluconazole Islt MIC PENDING  Incomplete   Flucytosine MIC PENDING  Incomplete   Itraconazole MIC PENDING  Incomplete   Voriconazole MIC PENDING  Incomplete   Source CANDIDA ALBICANS/BONE RIGHT FOOT  Final    Comment: Performed at [facility], [place] [address], [place], [place] [address]  Aerobic/Anaerobic Culture (surgical/deep wound)     Status: None   Collection Time: 12/09/20 10:06 AM   Specimen: Foot, Right; Wound  Result Value Ref Range Status   Specimen Description FOOT  Final   Special Requests RIGHT  Final   Gram Stain NO WBC SEEN NO ORGANISMS SEEN   Final   Culture   Final    FEW CANDIDA ALBICANS RARE ESCHERICHIA COLI NO ANAEROBES ISOLATED Performed at [facility], [address] [place], [place] [address]    Report Status 12/14/2020 FINAL  Final   Organism ID, Bacteria ESCHERICHIA COLI  Final      Susceptibility   Escherichia coli - MIC*    AMPICILLIN >=32 RESISTANT Resistant     CEFAZOLIN <=4 SENSITIVE Sensitive     CEFEPIME <=0.12 SENSITIVE Sensitive     CEFTAZIDIME <=1 SENSITIVE Sensitive     CEFTRIAXONE <=0.25 SENSITIVE Sensitive     CIPROFLOXACIN 0.5 SENSITIVE Sensitive     GENTAMICIN <=1 SENSITIVE Sensitive     IMIPENEM <=0.25 SENSITIVE Sensitive     TRIMETH/SULFA >=320 RESISTANT Resistant     AMPICILLIN/SULBACTAM >=32 RESISTANT Resistant     PIP/TAZO <=4 SENSITIVE Sensitive     * RARE ESCHERICHIA COLI  Aerobic/Anaerobic Culture (surgical/deep wound)      Status: None (Preliminary result)   Collection Time: 12/13/20  3:01 PM   Specimen: PATH Other; Tissue  Result Value Ref Range Status   Specimen Description TISSUE BONE  Final   Special Requests   Final    RT 3RD METATARSAL PREDEBRIDEMENT PT ON ANCEF CEFEPIME   Gram Stain NO WBC SEEN NO ORGANISMS SEEN   Final   Culture   Final    NO GROWTH < 24 HOURS Performed at [facility], [place] [address] [place], [place] [address]    Report Status PENDING  Incomplete  Acid Fast Smear (AFB)     Status: None   Collection Time: 12/13/20  3:01 PM  Result Value Ref Range Status   AFB Specimen Processing Concentration  Final   Acid Fast Smear Negative  Final    Comment: (NOTE) Performed At: [facility] [place], [place] [id number] Rush Farmer MD [id number]    Source (AFB) TISSUE  Final    Comment: BONE RT 3RD METATARSAL PREDEBRIDEMENT Performed at [facility], [place] [address], [place], [place] [address]   Aerobic/Anaerobic Culture (surgical/deep wound)     Status: None (Preliminary result)   Collection Time: 12/13/20  3:07 PM   Specimen: PATH Other; Tissue  Result Value Ref Range Status  Specimen Description BONE  Final   Special Requests   Final    RT 3RD METATARSAL PREDEBRIDEMENT PT ON ANCEF,CEFEPIME   Gram Stain NO WBC SEEN NO ORGANISMS SEEN   Final   Culture   Final    NO GROWTH < 24 HOURS Performed at [facility], [place] [address], [place], [place] [address]    Report Status PENDING  Incomplete  Acid Fast Smear (AFB)     Status: None   Collection Time: 12/13/20  3:07 PM  Result Value Ref Range Status   AFB Specimen Processing Concentration  Final   Acid Fast Smear Negative  Final    Comment: (NOTE) Performed At: [facility] [place], [place] [id number] Rush Farmer MD [id number]    Source (AFB) BONE  Final    Comment: RT 3RD METATARSAL PREDERIDEMENT Performed at [facility], [place] [address], [place], [place] [address]      Time coordinating discharge: 45 minutes  SIGNED:   Tawni Millers, MD  [facility] 12/15/2020, 9:36 AM

## 2020-12-15 NOTE — Progress Notes (Signed)
Physical Therapy Treatment & Discharge Patient Details Name: Benjamin Russo MRN: [id number] DOB: 1976/08/09 Today's Date: 12/15/2020    History of Present Illness 44 yo male with past medical history of T2DM, HTN, tobacco abuse and peripheral vascular disease. Recent right foot 4th toe amputation due to gangrene about 2 weeks ago. For the last 7 days before admission developed discoloration and edema on his 5th toe right foot. s/p I&D 5th toe with wound vac appliaction.    PT Comments    Goals met and education completed. Patient overall modI for mobility. Trialed axillary crutches this session. Patient ambulated 100' modI with crutches, educated patient on technique and sequencing and adjustment of crutches for when he returns home. No further skilled PT needs required at this time. No PT follow up recommended. PT will sign off.    Follow Up Recommendations  No PT follow up     Equipment Recommendations  None recommended by PT    Recommendations for Other Services       Precautions / Restrictions Precautions Precautions: Fall Required Braces or Orthoses: Other Brace Other Brace: cam boot Restrictions Weight Bearing Restrictions: Yes RLE Weight Bearing: Partial weight bearing RLE Partial Weight Bearing Percentage or Pounds: WB through heel Other Position/Activity Restrictions: Cam boot    Mobility  Bed Mobility Overal bed mobility: Independent                Transfers Overall transfer level: Modified independent Equipment used: Crutches             General transfer comment: Modified Independent with sit to stand and mobility with RW in room/hallway  Ambulation/Gait Ambulation/Gait assistance: Modified independent (Device/Increase time) Gait Distance (Feet): 100 Feet Assistive device: Crutches Gait Pattern/deviations: Step-to pattern     General Gait Details: steady with use of crutches, educated patient on adjusting crutches when at home   Stairs              Wheelchair Mobility    Modified Rankin (Stroke Patients Only)       Balance Overall balance assessment: Needs assistance Sitting-balance support: Feet supported Sitting balance-Leahy Scale: Normal     Standing balance support: Bilateral upper extremity supported;During functional activity;Single extremity supported Standing balance-Leahy Scale: Good Standing balance comment: reliant on AD for WB restrictions                            Cognition Arousal/Alertness: Awake/alert Behavior During Therapy: WFL for tasks assessed/performed Overall Cognitive Status: Within Functional Limits for tasks assessed                                        Exercises      General Comments General comments (skin integrity, edema, etc.): Educated on tub transfer safety, recommendation to confirm with doctor when to shower, use of folding chair as long as it has rubber feet on it and will not slip. Provided education on tips for IADL safety      Pertinent Vitals/Pain Pain Assessment: No/denies pain    Home Living Family/patient expects to be discharged to:: Private residence Living Arrangements: Spouse/significant other;Children Available Help at Discharge: Family;Available PRN/intermittently Type of Home: House Home Access: Stairs to enter Entrance Stairs-Rails: Left Home Layout: One level Home Equipment: Crutches;Other (comment) (a folding chair with rubber feet as shower chair) Additional Comments: Pt's wife works for [employer], on winter break after today and will be home to assist pt as needed    Prior Function Level of Independence: Independent with assistive device(s)          PT Goals (current goals can now be found in the care plan section) Acute Rehab PT Goals Patient Stated Goal: to go home PT Goal Formulation: With patient Time For Goal Achievement: 12/21/20 Potential to Achieve Goals: Good Progress towards PT  goals: Goals met/education completed, patient discharged from PT    Frequency    Min 3X/week      PT Plan Current plan remains appropriate    Co-evaluation              AM-PAC PT "6 Clicks" Mobility   Outcome Measure  Help needed turning from your back to your side while in a flat bed without using bedrails?: None Help needed moving from lying on your back to sitting on the side of a flat bed without using bedrails?: None Help needed moving to and from a bed to a chair (including a wheelchair)?: None Help needed standing up from a chair using your arms (e.g., wheelchair or bedside chair)?: None Help needed to walk in hospital room?: None Help needed climbing 3-5 steps with a railing? : None 6 Click Score: 24    End of Session Equipment Utilized During Treatment: Other (comment) (CAM boot) Activity Tolerance: Patient tolerated treatment well Patient left: in bed;with call bell/phone within reach Nurse Communication: Mobility status PT Visit Diagnosis: Difficulty in walking, not elsewhere classified (R26.2);Pain Pain - Right/Left: Right Pain - part of body: Ankle and joints of foot     Time: 4431-5400 PT Time Calculation (min) (ACUTE ONLY): 14 min  Charges:  $Gait Training: 8-22 mins                     Perrin Maltese, PT, DPT Acute Rehabilitation Services Pager [phone] Office [phone]    Benjamin Russo 12/15/2020, 11:54 AM

## 2020-12-15 NOTE — TOC Transition Note (Addendum)
Transition of Care [facility]) - CM/SW Discharge Note   Patient Details  Name: Benjamin Russo MRN: [id number] Date of Birth: 1976/02/28  [facility] CM/SW Contact:  Epifanio Lesches, RN Phone Number: 12/15/2020, 11:20 AM   Clinical Narrative:    Patient will DC to: home Anticipated DC date: 12/15/2020 Family notified: pt states will notify family Transport by: car  Right foot osteomyelitis. From home with wife    - S/P Incision and drainage of right foot for osteomyelitis and application of wound VAC Pt will need LT IV ABX THERAPY, end date 1/22.  Per MD patient ready for DC today. PICC line to be placed prior to d/c. RN, patient, patient's family aware of DC plan. DME: IV ABX will deliver to pt's home today. HHRN with [facility] will begin 12/15/2020. Home infusion teaching completed by Pam with [facility].  Pt without Rx med concerns.  Pt to f/u with PCP, noted on AVS.  RNCM will sign off for now as intervention is no longer needed. Please consult [place] again if new needs arise.   Final next level of care: Home w Home Health Services Barriers to Discharge: Continued Medical Work up   Patient Goals and CMS Choice     Choice offered to / list presented to : Patient  Discharge Placement                       Discharge Plan and Services                  DME Agency: Other - Comment ([facility]/ IV ABX therapy) Date DME Agency Contacted: 12/14/20 Time DME Agency Contacted: [phone] Representative spoke with at DME Agency: Pam HH Arranged: RN HH Agency:  ([facility]) Date HH Agency Contacted: 12/14/20 Time HH Agency Contacted: [phone] Representative spoke with at [facility] Agency: Pam  Social Determinants of Health (SDOH) Interventions     Readmission Risk Interventions No flowsheet data found.

## 2020-12-15 NOTE — Progress Notes (Signed)
Peripherally Inserted Central Catheter Placement  The IV Nurse has discussed with the patient and/or persons authorized to consent for the patient, the purpose of this procedure and the potential benefits and risks involved with this procedure.  The benefits include less needle sticks, lab draws from the catheter, and the patient may be discharged home with the catheter. Risks include, but not limited to, infection, bleeding, blood clot (thrombus formation), and puncture of an artery; nerve damage and irregular heartbeat and possibility to perform a PICC exchange if needed/ordered by physician.  Alternatives to this procedure were also discussed.  Bard Power PICC patient education guide, fact sheet on infection prevention and patient information card has been provided to patient /or left at bedside.    PICC Placement Documentation  PICC Single Lumen 12/15/20 PICC Right Brachial 38 cm 1 cm (Active)  Indication for Insertion or Continuance of Line Home intravenous therapies (PICC only) 12/15/20 1238  Exposed Catheter (cm) 1 cm 12/15/20 1238  Site Assessment Clean;Dry;Intact 12/15/20 1238  Line Status Flushed;Saline locked;Blood return noted 12/15/20 1238  Dressing Type Transparent;Securing device 12/15/20 1238  Dressing Status Clean;Dry;Intact 12/15/20 1238  Antimicrobial disc in place? Yes 12/15/20 1238  Safety Lock Not Applicable 12/15/20 1238  Dressing Intervention New dressing 12/15/20 1238  Dressing Change Due 12/22/20 12/15/20 1238       Annett Fabian 12/15/2020, 12:40 PM

## 2020-12-15 NOTE — Discharge Instructions (Signed)
Cholesterol medication has been held due to a drug interaction with Fluconazole. It is very important for you to follow up with a doctor about treating your cholesterol once you have completed your Fluconazole treatment course

## 2020-12-18 LAB — AEROBIC/ANAEROBIC CULTURE W GRAM STAIN (SURGICAL/DEEP WOUND)
Culture: NO GROWTH
Gram Stain: NONE SEEN
Gram Stain: NONE SEEN

## 2020-12-19 ENCOUNTER — Other Ambulatory Visit: Payer: Self-pay

## 2020-12-19 ENCOUNTER — Ambulatory Visit (INDEPENDENT_AMBULATORY_CARE_PROVIDER_SITE_OTHER): Payer: Commercial Managed Care - PPO | Admitting: Sports Medicine

## 2020-12-19 ENCOUNTER — Encounter: Payer: Self-pay | Admitting: Sports Medicine

## 2020-12-19 DIAGNOSIS — I96 Gangrene, not elsewhere classified: Secondary | ICD-10-CM

## 2020-12-19 DIAGNOSIS — Z9889 Other specified postprocedural states: Secondary | ICD-10-CM

## 2020-12-19 DIAGNOSIS — L97514 Non-pressure chronic ulcer of other part of right foot with necrosis of bone: Secondary | ICD-10-CM

## 2020-12-19 DIAGNOSIS — I739 Peripheral vascular disease, unspecified: Secondary | ICD-10-CM

## 2020-12-19 NOTE — Progress Notes (Signed)
Subjective: Benjamin Russo is a 44 y.o. male patient seen today in office for POV #1 (DOS 12/09/2020), S/P right foot delayed wound closure with debridement and excision of third metatarsal head performed by Dr. March Rummage.  Patient denies pain at surgical site, denies calf pain, denies headache, chest pain, shortness of breath, nausea, vomiting, fever, or chills. Patient states that his wound VAC is not working properly he will not stay on is making a funny noise.  Patient reports that the nurse will come out tomorrow to check it he states that he tried to get in office on yesterday but the office was closed. No other issues noted.   Patient is assisted by son this visit  Patient Active Problem List   Diagnosis Date Noted  . Post-op pain   . Post-operative state   . Ulcer of right foot, with necrosis of bone ([place])   . Cellulitis of right foot 12/05/2020  . Acute osteomyelitis of toe, right ([place]) 11/22/2020  . Postoperative examination 11/22/2020  . Cellulitis and abscess of toe of right foot 11/14/2020  . Gangrene ([place]) 11/14/2020  . Screening examination for other arthropod-borne viral diseases 11/14/2020  . Cellulitis 10/15/2020  . Hypertension 10/15/2020  . Type 2 diabetes mellitus ([place]) 10/15/2020  . Cellulitis of leg, right 07/23/2018    Current Outpatient Medications on File Prior to Visit  Medication Sig Dispense Refill  . acetaminophen (TYLENOL) 500 MG tablet Take 1-2 tablets (500-1,000 mg total) by mouth every 8 (eight) hours as needed for mild pain. Do not take more than **$RemoveBef'3000mg'GgKAcPEwxk$ ** per day 100 tablet 0  . aspirin EC 81 MG tablet Take 1 tablet (81 mg total) by mouth daily. Swallow whole. 30 tablet 2  . BD PEN NEEDLE NANO 2ND GEN 32G X 4 MM MISC SMARTSIG:Injection As Directed    . ceFAZolin (ANCEF) IVPB Inject 2 g into the vein every 8 (eight) hours. Indication:  Right foot osteomyelitis  First Dose: Yes Last Day of Therapy:  01/24/2021 Labs - Once weekly:  CBC/D and BMP, Labs -  Every other week:  ESR and CRP Method of administration: IV Push Method of administration may be changed at the discretion of home infusion pharmacist based upon assessment of the patient and/or caregiver's ability to self-administer the medication ordered. 123 Units 0  . Continuous Blood Gluc Sensor (FREESTYLE LIBRE 2 SENSOR) MISC See admin instructions.    . Ensure Max Protein (ENSURE MAX PROTEIN) LIQD Take 330 mLs (11 oz total) by mouth at bedtime. 9900 mL 0  . fluconazole (DIFLUCAN) 200 MG tablet Take 2 tablets (400 mg total) by mouth daily. 60 tablet 5  . ibuprofen (IBU) 400 MG tablet Take 1 tablet (400 mg total) by mouth every 6 (six) hours as needed for moderate pain. 30 tablet 0  . insulin glargine (LANTUS SOLOSTAR) 100 UNIT/ML Solostar Pen Inject 25 Units into the skin at bedtime.     . metFORMIN (GLUCOPHAGE) 500 MG tablet Take 1 tablet (500 mg total) by mouth 2 (two) times daily with a meal. 60 tablet 0  . metoprolol succinate (TOPROL-XL) 25 MG 24 hr tablet Take 25 mg by mouth daily.    . Multiple Vitamin (MULTIVITAMIN WITH MINERALS) TABS tablet Take 1 tablet by mouth daily. 30 tablet 0  . nutrition supplement, JUVEN, (JUVEN) PACK Take 1 packet by mouth 2 (two) times daily between meals. 60 packet 0  . polyethylene glycol (MIRALAX / GLYCOLAX) 17 g packet Take 17 g by mouth daily as needed  for moderate constipation. 14 each 0   No current facility-administered medications on file prior to visit.    No Known Allergies  Objective: There were no vitals filed for this visit.  General: No acute distress, AAOx3  Right foot: Sutures intact with no gapping or dehiscence at surgical site, moderate maceration likely from malfunctioning wound VAC, minimal swelling and minimal blanchable erythema, no warmth, no active drainage, no acute signs of infection noted, capillary fill time intact less than 5 seconds to toes 1 through 3 on the right foot, no pain or crepitation with range of motion right  foot or pain to calf.  Status post digital amputation and right third metatarsal head resection.  Assessment and Plan:  Problem List Items Addressed This Visit      Other   Post-operative state    Other Visit Diagnoses    Gangrene of toe ([place])    -  Primary   Ulcer of right foot with necrosis of bone (HCC)       Gangrene of right foot ([place])       PAD (peripheral artery disease) ([place])           -Patient seen and evaluated -X-rays reviewed from hospital 12/13/2020 -Wound VAC was reapplied but no suction so at this time discontinued and advised patient to have home nurse to see if they can get it working on tomorrow if not to continue with Betadine and dry dressing as I applied on today's visit to the right foot -Encourage patient to continue with cam boot and to limit activity and walking -Advised patient to continue with rest and elevation to assist with healing -Continue with antibiotics -Will plan for postop wound care with Dr. March Rummage at next office visit. In the meantime, patient to call office if any issues or problems arise.   Landis Martins, DPM

## 2020-12-28 ENCOUNTER — Ambulatory Visit (INDEPENDENT_AMBULATORY_CARE_PROVIDER_SITE_OTHER): Payer: Commercial Managed Care - PPO | Admitting: Podiatry

## 2020-12-28 ENCOUNTER — Encounter: Payer: Self-pay | Admitting: Podiatry

## 2020-12-28 ENCOUNTER — Other Ambulatory Visit: Payer: Self-pay

## 2020-12-28 ENCOUNTER — Ambulatory Visit (INDEPENDENT_AMBULATORY_CARE_PROVIDER_SITE_OTHER): Payer: Commercial Managed Care - PPO

## 2020-12-28 DIAGNOSIS — I96 Gangrene, not elsewhere classified: Secondary | ICD-10-CM | POA: Diagnosis not present

## 2020-12-30 LAB — AEROBIC/ANAEROBIC CULTURE W GRAM STAIN (SURGICAL/DEEP WOUND): Gram Stain: NONE SEEN

## 2021-01-02 IMAGING — DX DG FOOT 2V*R*
2 series · 2 of 2 positions shown · non-contrast
Comparison: 12/07/2020

CLINICAL DATA: Ulcer, osteomyelitis, toe amputations

EXAM:
RIGHT FOOT - 2 VIEW

[foot lat]
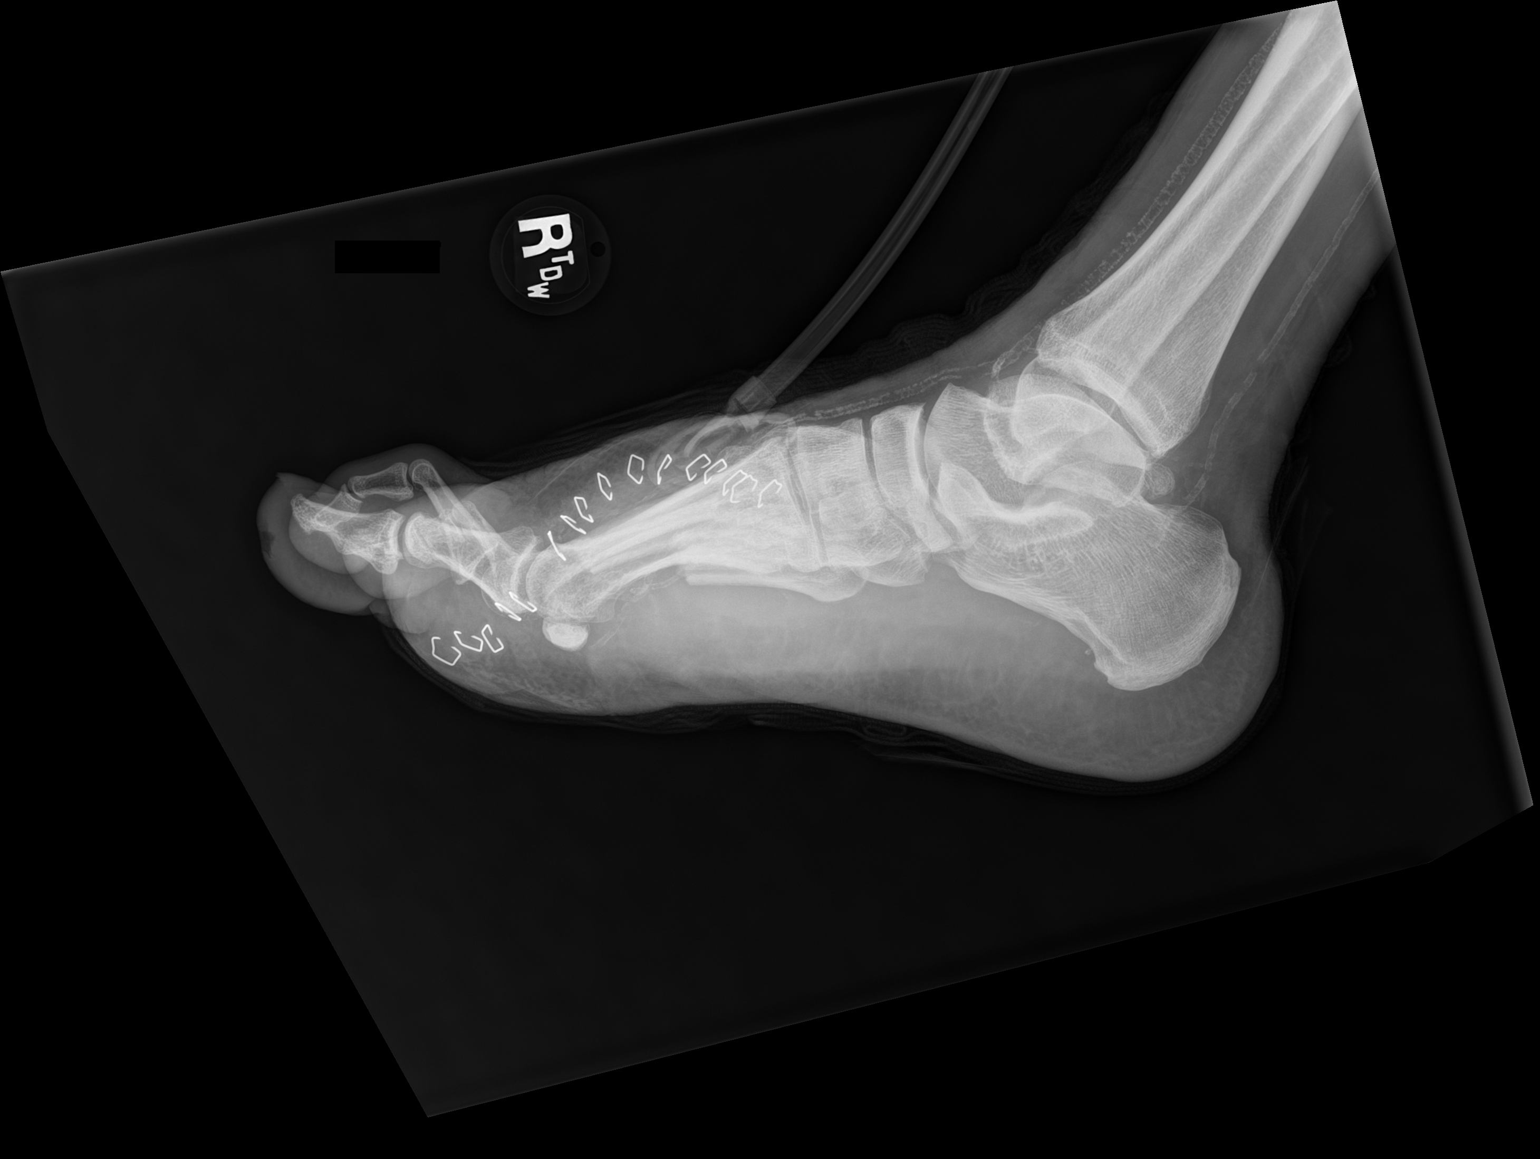

[foot ap]
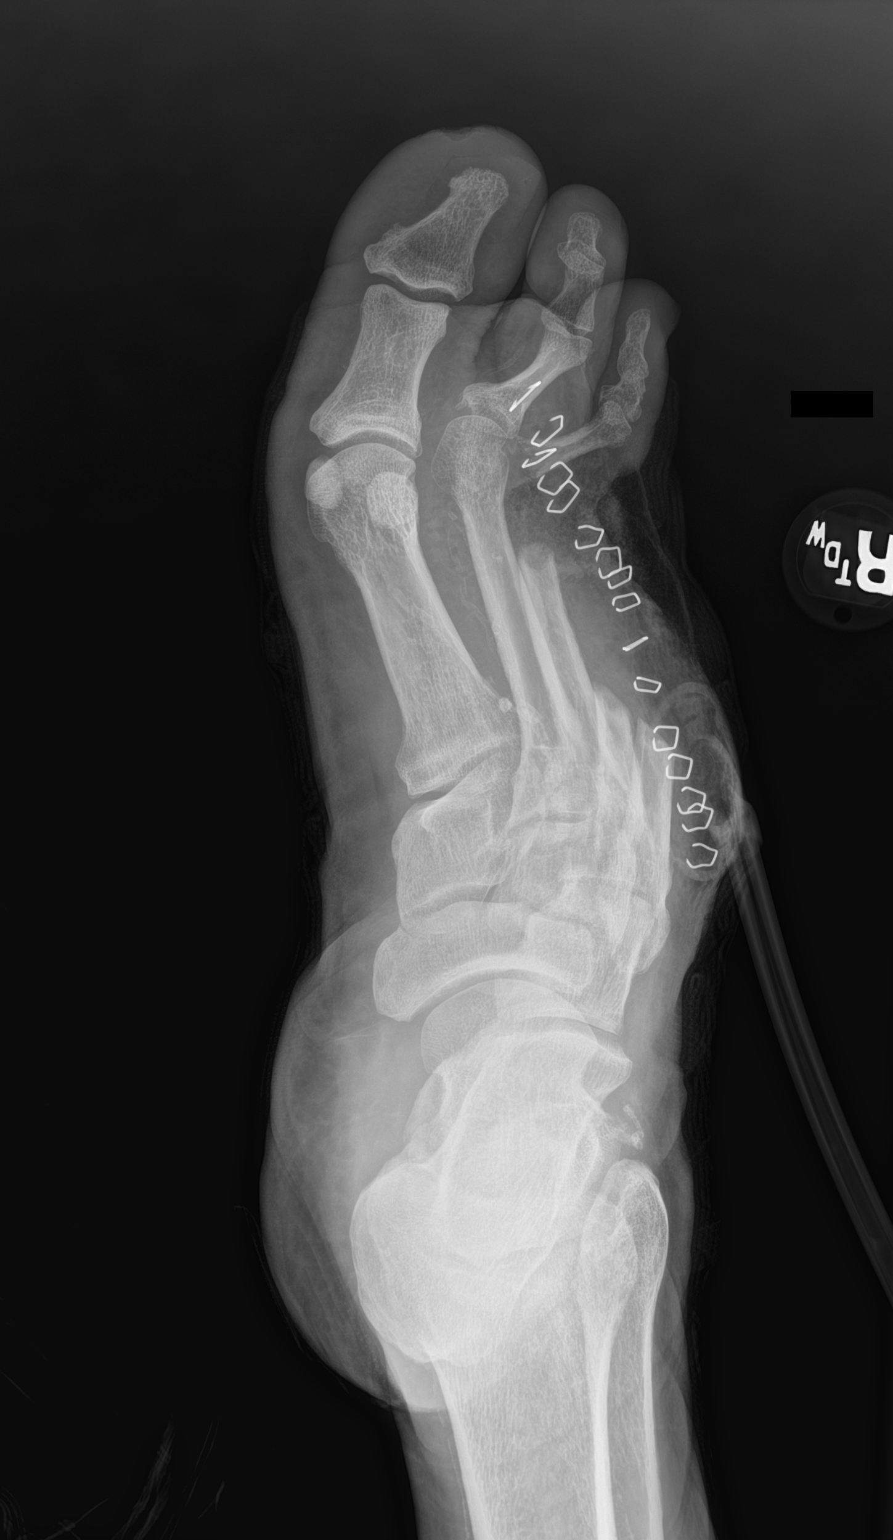

[2 of 2 positions shown; findings below may reference images not displayed]

FINDINGS: Additional resection of the right third metatarsal head. Previous
amputations of the fourth and fifth toes back to the proximal
metatarsal level. Overlying skin staples noted and dressing/wound
VAC. Overall stable alignment. Peripheral vascular calcification on
lateral view. Soft tissue swelling evident.
IMPRESSION: Postop changes as above. No unexpected finding by plain radiography.

## 2021-01-04 ENCOUNTER — Other Ambulatory Visit: Payer: Self-pay | Admitting: *Deleted

## 2021-01-04 ENCOUNTER — Ambulatory Visit (INDEPENDENT_AMBULATORY_CARE_PROVIDER_SITE_OTHER): Payer: Commercial Managed Care - PPO | Admitting: Podiatry

## 2021-01-04 ENCOUNTER — Other Ambulatory Visit: Payer: Self-pay

## 2021-01-04 ENCOUNTER — Encounter: Payer: Self-pay | Admitting: Podiatry

## 2021-01-04 ENCOUNTER — Ambulatory Visit: Payer: Commercial Managed Care - PPO | Admitting: Podiatry

## 2021-01-04 DIAGNOSIS — L97514 Non-pressure chronic ulcer of other part of right foot with necrosis of bone: Secondary | ICD-10-CM

## 2021-01-04 DIAGNOSIS — Z9889 Other specified postprocedural states: Secondary | ICD-10-CM

## 2021-01-06 IMAGING — DX DG FOOT 2V*R*
2 series · 2 of 2 positions shown · non-contrast
Comparison: 12/09/2020

CLINICAL DATA: Status post amputation

EXAM:
RIGHT FOOT - 2 VIEW

[foot ap]
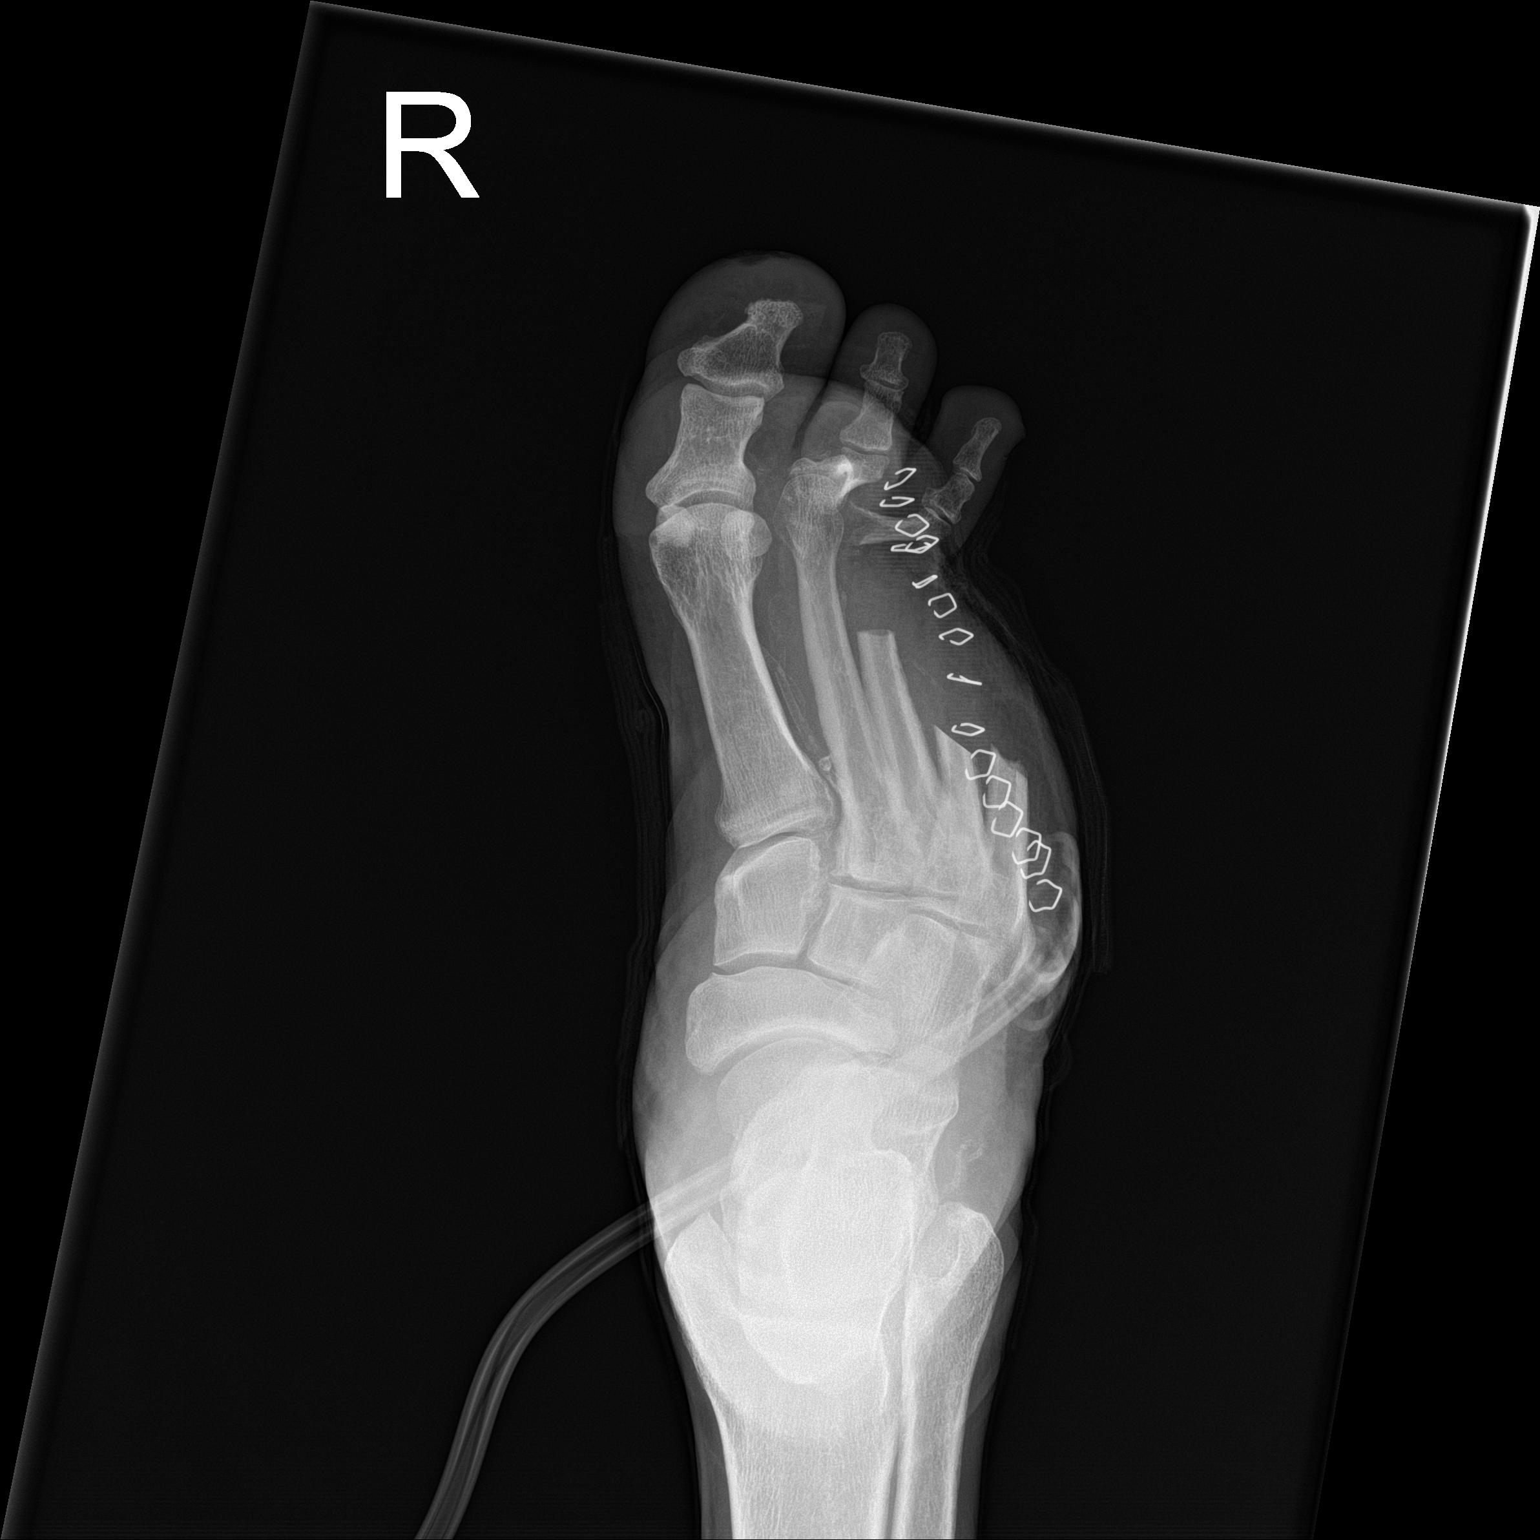

[foot lat]
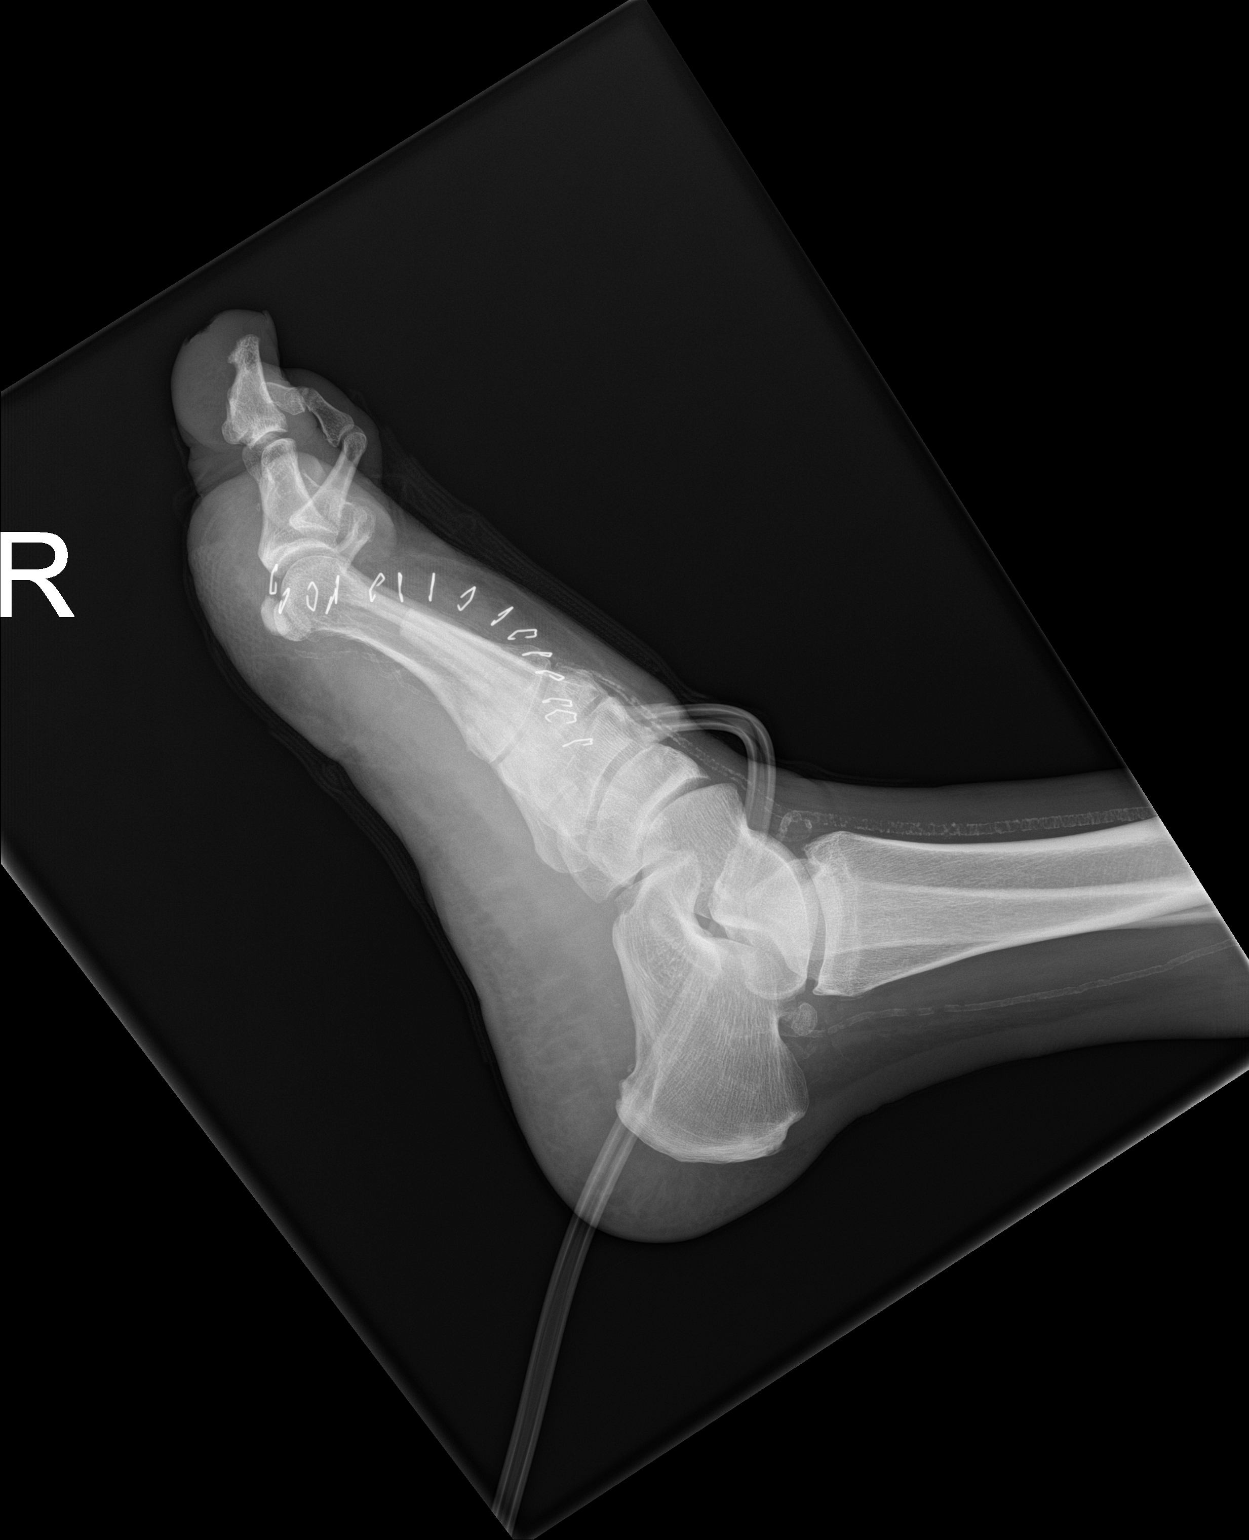

[2 of 2 positions shown; findings below may reference images not displayed]

FINDINGS: Surgical staples and wound VAC over the lateral aspect of the right
foot. Status post mid metatarsal amputation of the third, fourth and
fifth rays. The third digit phalanges remain. No acute abnormality.
Extensive vascular calcification. Small ulceration of the distal
great toe, unchanged.
IMPRESSION: Status post mid metatarsal amputation of the right foot.

## 2021-01-09 LAB — FUNGAL ORGANISM REFLEX

## 2021-01-09 LAB — FUNGUS CULTURE WITH STAIN

## 2021-01-09 LAB — FUNGUS CULTURE RESULT

## 2021-01-12 NOTE — Progress Notes (Signed)
  Subjective:  Patient ID: Benjamin Russo, male    DOB: [birth year],  MRN: [id number]  Chief Complaint  Patient presents with  . Routine Post Op    I am ok on the right foot and no one has came out to check the right foot, the nurse just came and checked my pic line   45 y.o. male presents with the above complaint. History confirmed with patient.   Objective:  Physical Exam: tenderness at the surgical site, local edema noted and calf supple, nontender. Incision: no dehiscence, no significant erythema, slow healing, slight clear drainage present.  There is residual small open area at the lateral aspect of the third toe.  Radiographs: X-ray of the right foot: No further osteolysis or bone erosion.  Stable partial resections of the fourth fifth and third metatarsals.  Evidence of prior amputation fourth fifth toes.  Assessment:   1. Gangrene of toe (HCC)     Plan:  Patient was evaluated and treated and all questions answered.  S/p Partial 4th/5th ray resection, 3rd metatarsal resection with residual OM -Wound is overall doing okay. -Discussed should wound worsen we would consider revising to TMA. -We will look into adding on wound care services for patient. -Continue antibiotics and antifungal per the direction of ID. -Dressed with Betadine wet-to-dry dressing today.   No follow-ups on file.

## 2021-01-18 ENCOUNTER — Ambulatory Visit: Payer: Self-pay | Admitting: Podiatry

## 2021-01-18 ENCOUNTER — Ambulatory Visit (INDEPENDENT_AMBULATORY_CARE_PROVIDER_SITE_OTHER): Payer: Commercial Managed Care - PPO | Admitting: Podiatry

## 2021-01-18 ENCOUNTER — Other Ambulatory Visit: Payer: Self-pay

## 2021-01-18 ENCOUNTER — Encounter: Payer: Self-pay | Admitting: Podiatry

## 2021-01-18 DIAGNOSIS — Z9889 Other specified postprocedural states: Secondary | ICD-10-CM

## 2021-01-18 DIAGNOSIS — L97514 Non-pressure chronic ulcer of other part of right foot with necrosis of bone: Secondary | ICD-10-CM

## 2021-01-18 NOTE — Progress Notes (Signed)
  Subjective:  Patient ID: Benjamin Russo, male    DOB: 05-14-1976,  MRN: [id number]  Chief Complaint  Patient presents with  . Routine Post Op    I have been changing the dressing and it does have an odor to it and the boot seems to help   45 y.o. male presents with the above complaint. History confirmed with patient.   Objective:  Physical Exam: tenderness at the surgical site, local edema noted and calf supple, nontender. Incision: no dehiscence, no significant erythema, slow healing, slight clear drainage present.  There is residual small open area at the lateral aspect of the third toe. No exposed bone.  Assessment:   1. 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 Post-operative state     Plan:  Patient was evaluated and treated and all questions answered.  S/p Partial 4th/5th ray resection, 3rd metatarsal resection with residual OM -Wound is slow to heal. The sutures have lost tension indicative of weightbearing to the extremity. At this point I discussed debridement and wound repair to promote healing. Consent reviewed and signed by patient. -Betadine WTD applied today and ordered for home changes 2x weekly. -Discussed limiting weightbearing to promote healing. -Patient has failed all conservative therapy and wishes to proceed with surgical intervention. All risks, benefits, and alternatives discussed with patient. No guarantees given. Consent reviewed and signed by patient. -Planned procedures: Right foot debridement and irrigation, possible wound repair -Identified risk factors: DM, tobacco use.  No follow-ups on file.

## 2021-01-19 ENCOUNTER — Ambulatory Visit (INDEPENDENT_AMBULATORY_CARE_PROVIDER_SITE_OTHER): Payer: Commercial Managed Care - PPO | Admitting: Infectious Disease

## 2021-01-19 ENCOUNTER — Encounter: Payer: Self-pay | Admitting: Infectious Disease

## 2021-01-19 ENCOUNTER — Telehealth: Payer: Self-pay

## 2021-01-19 VITALS — BP 153/87 | HR 90 | Temp 97.3°F | Resp 16 | Ht 70.0 in | Wt 201.0 lb

## 2021-01-19 DIAGNOSIS — I96 Gangrene, not elsewhere classified: Secondary | ICD-10-CM

## 2021-01-19 DIAGNOSIS — M869 Osteomyelitis, unspecified: Secondary | ICD-10-CM

## 2021-01-19 DIAGNOSIS — L03031 Cellulitis of right toe: Secondary | ICD-10-CM | POA: Diagnosis not present

## 2021-01-19 DIAGNOSIS — M86171 Other acute osteomyelitis, right ankle and foot: Secondary | ICD-10-CM | POA: Diagnosis not present

## 2021-01-19 DIAGNOSIS — L97514 Non-pressure chronic ulcer of other part of right foot with necrosis of bone: Secondary | ICD-10-CM | POA: Diagnosis not present

## 2021-01-19 DIAGNOSIS — L02611 Cutaneous abscess of right foot: Secondary | ICD-10-CM

## 2021-01-19 HISTORY — DX: Osteomyelitis, unspecified: M86.9

## 2021-01-19 MED ORDER — CEPHALEXIN 500 MG PO CAPS: 500.0000 mg | ORAL_CAPSULE | Freq: Four times a day (QID) | ORAL | 0 refills | Status: DC

## 2021-01-19 MED ORDER — AMOXICILLIN-POT CLAVULANATE 875-125 MG PO TABS: 1.0000 | ORAL_TABLET | Freq: Two times a day (BID) | ORAL | 0 refills | Status: DC

## 2021-01-19 MED ORDER — FLUCONAZOLE 200 MG PO TABS: 400.0000 mg | ORAL_TABLET | Freq: Every day | ORAL | 5 refills | Status: AC

## 2021-01-19 NOTE — Progress Notes (Signed)
Subjective:    Patient ID: Benjamin Russo, male    DOB: May 28, 1976, 45 y.o.   MRN: [id number]  HPI  45 year old Hispanic gentleman with history of diabetes mellitus type 2 that has not been well controlled who developed diabetic foot ulcer with osteomyelitis requiring fifth ray amputation I&D of complex foot abscess and third metatarsal section 3rd partial phalangectomy, cultures yielding Candida but Kantz and E. coli.  He was taken back to the operating room repeat treatment with cultures again yielding Candida albicans.  He was discharged on IV cefazolin he is currently taking along with oral fluconazole.  He is seen Dr. March Rummage in follow-up and is scheduled for some further debridement of his wound.  There is consideration of need for possible transmetatarsal amputation.  Patient himself has noticed in the last week or 2 that he had increased drainage from the wound with a more foul smell to it.  He is currently scheduled for surgery with Dr. March Rummage on January 26.    Past Medical History:  Diagnosis Date  . Diabetes mellitus without complication ([place])   . Hypertension     Past Surgical History:  Procedure Laterality Date  . BONE BIOPSY Right 12/13/2020   Procedure: BONE BIOPSY;  Surgeon: Evelina Bucy, DPM;  Location: [facility];  Service: Podiatry;  Laterality: Right;  . FINGER SURGERY Left 2019  . I & D EXTREMITY Right 12/07/2020   Procedure: IRRIGATION AND DEBRIDEMENT, REMOVAL OF 5th TOE, 4th AND 5th METATARSAL;  Surgeon: Evelina Bucy, DPM;  Location: [facility];  Service: Podiatry;  Laterality: Right;  . I & D EXTREMITY Right 12/13/2020   Procedure: IRRIGATION AND DEBRIDEMENT OF FOOT;  Surgeon: Evelina Bucy, DPM;  Location: [facility];  Service: Podiatry;  Laterality: Right;  . METATARSAL HEAD EXCISION Right 12/09/2020   Procedure: 3rd METATARSAL HEAD EXCISION;  Surgeon: Evelina Bucy, DPM;  Location: [facility];  Service: Podiatry;  Laterality: Right;  . WOUND DEBRIDEMENT  Right 12/09/2020   Procedure: Right foot wound debridment and delayed closure;  Surgeon: Evelina Bucy, DPM;  Location: [facility];  Service: Podiatry;  Laterality: Right;    Family History  Problem Relation Age of Onset  . Diabetes Mellitus II Father   . Diabetes Mellitus II Brother       Social History   Socioeconomic History  . Marital status: Married    Spouse name: Press photographer  . Number of children: 1  . Years of education: 91  . Highest education level: GED or equivalent  Occupational History  . Occupation: Warehouse    Comment: Not currently working  Tobacco Use  . Smoking status: Current Every Day Smoker    Packs/day: 0.25    Types: Cigarettes  . Smokeless tobacco: Never Used  Vaping Use  . Vaping Use: Never used  Substance and Sexual Activity  . Alcohol use: Yes    Comment: occasional  . Drug use: Never  . Sexual activity: Not Currently  Other Topics Concern  . Not on file  Social History Narrative  . Not on file   Social Determinants of Health   Financial Resource Strain: Not on file  Food Insecurity: Not on file  Transportation Needs: Not on file  Physical Activity: Not on file  Stress: Not on file  Social Connections: Not on file    No Known Allergies   Current Outpatient Medications:  .  acetaminophen (TYLENOL) 500 MG tablet, Take 1-2 tablets (500-1,000 mg total) by mouth every  8 (eight) hours as needed for mild pain. Do not take more than **$RemoveBef'3000mg'piNfbkCbEq$ ** per day, Disp: 100 tablet, Rfl: 0 .  aspirin EC 81 MG tablet, Take 1 tablet (81 mg total) by mouth daily. Swallow whole., Disp: 30 tablet, Rfl: 2 .  BD PEN NEEDLE NANO 2ND GEN 32G X 4 MM MISC, SMARTSIG:Injection As Directed, Disp: , Rfl:  .  ceFAZolin (ANCEF) IVPB, Inject 2 g into the vein every 8 (eight) hours. Indication:  Right foot osteomyelitis  First Dose: Yes Last Day of Therapy:  01/24/2021 Labs - Once weekly:  CBC/D and BMP, Labs - Every other week:  ESR and CRP Method of administration: IV  Push Method of administration may be changed at the discretion of home infusion pharmacist based upon assessment of the patient and/or caregiver's ability to self-administer the medication ordered., Disp: 123 Units, Rfl: 0 .  Continuous Blood Gluc Sensor (FREESTYLE LIBRE 2 SENSOR) MISC, See admin instructions., Disp: , Rfl:  .  fluconazole (DIFLUCAN) 200 MG tablet, Take 2 tablets (400 mg total) by mouth daily., Disp: 60 tablet, Rfl: 5 .  ibuprofen (IBU) 400 MG tablet, Take 1 tablet (400 mg total) by mouth every 6 (six) hours as needed for moderate pain., Disp: 30 tablet, Rfl: 0 .  insulin glargine (LANTUS SOLOSTAR) 100 UNIT/ML Solostar Pen, Inject 25 Units into the skin at bedtime. , Disp: , Rfl:  .  metFORMIN (GLUCOPHAGE) 500 MG tablet, Take 1 tablet (500 mg total) by mouth 2 (two) times daily with a meal., Disp: 60 tablet, Rfl: 0 .  metoprolol succinate (TOPROL-XL) 25 MG 24 hr tablet, Take 25 mg by mouth daily., Disp: , Rfl:  .  polyethylene glycol (MIRALAX / GLYCOLAX) 17 g packet, Take 17 g by mouth daily as needed for moderate constipation., Disp: 14 each, Rfl: 0 .  polyethylene glycol powder (GLYCOLAX/MIRALAX) 17 GM/SCOOP powder, SMARTSIG:17 Gram(s) By Mouth Daily PRN, Disp: , Rfl:    Review of Systems  Constitutional: Negative for activity change, appetite change, chills, diaphoresis, fatigue, fever and unexpected weight change.  HENT: Negative for congestion, rhinorrhea, sinus pressure, sneezing, sore throat and trouble swallowing.   Eyes: Negative for photophobia and visual disturbance.  Respiratory: Negative for cough, chest tightness, shortness of breath, wheezing and stridor.   Cardiovascular: Negative for chest pain, palpitations and leg swelling.  Gastrointestinal: Negative for abdominal distention, abdominal pain, anal bleeding, blood in stool, constipation, diarrhea, nausea and vomiting.  Genitourinary: Negative for difficulty urinating, dysuria, flank pain and hematuria.   Musculoskeletal: Negative for arthralgias, back pain, gait problem, joint swelling and myalgias.  Skin: Positive for color change and wound. Negative for rash.  Neurological: Negative for dizziness, tremors, weakness and light-headedness.  Hematological: Negative for adenopathy. Does not bruise/bleed easily.  Psychiatric/Behavioral: Negative for agitation, behavioral problems, confusion, decreased concentration, dysphoric mood and sleep disturbance.       Objective:   Physical Exam Constitutional:      General: He is not in acute distress.    Appearance: Normal appearance. He is well-developed and well-nourished. He is not ill-appearing or diaphoretic.  HENT:     Head: Normocephalic and atraumatic.     Right Ear: Hearing and external ear normal.     Left Ear: Hearing and external ear normal.     Nose: No nasal deformity, rhinorrhea or epistaxis.  Eyes:     General: No scleral icterus.    Extraocular Movements: EOM normal.     Conjunctiva/sclera: Conjunctivae normal.     Right  eye: Right conjunctiva is not injected.     Left eye: Left conjunctiva is not injected.     Pupils: Pupils are equal, round, and reactive to light.  Neck:     Vascular: No JVD.  Cardiovascular:     Rate and Rhythm: Normal rate and regular rhythm.     Heart sounds: Normal heart sounds, S1 normal and S2 normal. No murmur heard. No friction rub.  Pulmonary:     Effort: Pulmonary effort is normal. No respiratory distress.  Abdominal:     General: There is no distension or ascites.     Palpations: Abdomen is soft. There is no hepatosplenomegaly.  Musculoskeletal:        General: Normal range of motion.     Right shoulder: Normal.     Left shoulder: Normal.     Cervical back: Normal range of motion and neck supple.     Right hip: Normal.     Left hip: Normal.     Right knee: Normal.     Left knee: Normal.  Lymphadenopathy:     Head:     Right side of head: No submandibular, preauricular or posterior  auricular adenopathy.     Left side of head: No submandibular, preauricular or posterior auricular adenopathy.     Cervical: No cervical adenopathy.     Right cervical: No superficial or deep cervical adenopathy.    Left cervical: No superficial or deep cervical adenopathy.  Skin:    General: Skin is warm, dry and intact.     Coloration: Skin is not pale.     Findings: No abrasion, bruising, ecchymosis, erythema, lesion or rash.     Nails: There is no clubbing or cyanosis.  Neurological:     Mental Status: He is alert and oriented to person, place, and time.     Sensory: No sensory deficit.     Coordination: Coordination normal.     Gait: Gait normal.     Deep Tendon Reflexes: Strength normal.  Psychiatric:        Attention and Perception: Attention normal. He is attentive.        Mood and Affect: Mood is anxious.        Speech: Speech normal.        Behavior: Behavior normal. Behavior is cooperative.        Thought Content: Thought content normal.        Cognition and Memory: Cognition and memory normal.        Judgment: Judgment normal.      Right foot January 19, 2021:      PICC line January 19, 2021:         Assessment & Plan:   Diabetic foot ulcer with polymicrobial osteomyelitis: He is going back to the operating room on June 26 for further debridement.  Assuming he is doing well we will finish his antibiotics that same day and have the PICC line pulled may be this can be done while he is in the operating room otherwise we will have pulled by home health.  I have sent a prescription for Keflex which would like him to start the next day again presuming he is doing relatively well.  He also continue fluconazole which he will need to complete a  total of year of  Therapy.  Certainly if he stays as an inpatient to clear if the operative findings are concerning a light Dr. [name] to reach out to [place] so that we can see  him as an inpatient I will be in the inpatient  service next week when he is having his surgery.

## 2021-01-19 NOTE — Telephone Encounter (Signed)
DOS 01/24/2021  DEBRIDEMENT & IRRIGATION OF RT FOOT - 13160  [facility] EFFECTIVE DATE - 12/30/2020  PLAN DEDUCTIBLE - $5000.00 W/ $7078.67 REMAINING OUT OF POCKET - $6600.00 W/ $5449.20 REMAINING COPAY $0.00 COINSURANCE - 70%  SPOKE TO LEN S AT [facility], CALL REF # [id number]. SHE STATED NO PRECERT IS REQUIRED FOR CPT 13160.

## 2021-01-20 ENCOUNTER — Encounter: Payer: Self-pay | Admitting: Infectious Disease

## 2021-01-22 ENCOUNTER — Other Ambulatory Visit: Payer: Self-pay

## 2021-01-22 ENCOUNTER — Encounter (HOSPITAL_BASED_OUTPATIENT_CLINIC_OR_DEPARTMENT_OTHER): Payer: Self-pay | Admitting: Podiatry

## 2021-01-22 NOTE — Progress Notes (Addendum)
ADDENDUM:  Received pt's pcp H&P via fax from Dr [name] office, faxed to [facility] to be placed on chart.  Spoke w/ via phone for pre-op interview--- PT Lab needs dos----Istat and EKG               Lab results------ pt had CBC/ CMP done 01-11-2021 results in care everywhre COVID test ------ 01-23-2021 @ 1000 Arrive at ------- 1200 NPO after MN NO Solid Food.  Clear liquids from MN until--- 1100 Medications to take morning of surgery ----- If gets Toprol refilled to take this am dos Diabetic medication ----- do note take metformin morning of surgery and night before surgery do half dose of lantus insulin Patient Special Instructions ----- n/a Pre-Op special Istructions ----- have not received pt's pcp H&P yet from dr price office Patient verbalized understanding of instructions that were given at this phone interview. Patient denies shortness of breath, chest pain, fever, cough at this phone interview.

## 2021-01-23 ENCOUNTER — Other Ambulatory Visit (HOSPITAL_COMMUNITY)
Admission: RE | Admit: 2021-01-23 | Discharge: 2021-01-23 | Disposition: A | Discharge status: Home / Self Care | Payer: Commercial Managed Care - PPO | Source: Ambulatory Visit | Attending: Podiatry | Admitting: Podiatry

## 2021-01-23 DIAGNOSIS — Z20822 Contact with and (suspected) exposure to covid-19: Secondary | ICD-10-CM | POA: Diagnosis not present

## 2021-01-23 DIAGNOSIS — Z01812 Encounter for preprocedural laboratory examination: Secondary | ICD-10-CM | POA: Diagnosis present

## 2021-01-23 LAB — SARS CORONAVIRUS 2 (TAT 6-24 HRS): SARS Coronavirus 2: NEGATIVE

## 2021-01-24 ENCOUNTER — Ambulatory Visit (HOSPITAL_BASED_OUTPATIENT_CLINIC_OR_DEPARTMENT_OTHER)
Admission: RE | Admit: 2021-01-24 | Payer: Commercial Managed Care - PPO | Source: Home / Self Care | Admitting: Podiatry

## 2021-01-24 HISTORY — DX: Anemia, unspecified: D64.9

## 2021-01-24 HISTORY — DX: Type 2 diabetes mellitus without complications: E11.9

## 2021-01-24 HISTORY — DX: Type 2 diabetes mellitus with foot ulcer: L97.519

## 2021-01-24 HISTORY — DX: Type 2 diabetes mellitus with foot ulcer: E11.621

## 2021-01-24 HISTORY — DX: Long term (current) use of insulin: Z79.4

## 2021-01-24 SURGERY — DEBRIDEMENT, WOUND
Anesthesia: Monitor Anesthesia Care | Laterality: Right

## 2021-01-24 MED ORDER — VANCOMYCIN HCL 1000 MG IV SOLR
INTRAVENOUS | Status: AC
  Filled 2021-01-24: qty 1000

## 2021-01-25 ENCOUNTER — Ambulatory Visit: Payer: Commercial Managed Care - PPO | Admitting: Podiatry

## 2021-01-26 LAB — ACID FAST CULTURE WITH REFLEXED SENSITIVITIES (MYCOBACTERIA)
Acid Fast Culture: NEGATIVE
Acid Fast Culture: NEGATIVE

## 2021-01-29 ENCOUNTER — Encounter: Payer: Self-pay | Admitting: Podiatry

## 2021-01-29 ENCOUNTER — Other Ambulatory Visit: Payer: Self-pay

## 2021-01-29 ENCOUNTER — Encounter (HOSPITAL_BASED_OUTPATIENT_CLINIC_OR_DEPARTMENT_OTHER): Payer: Self-pay | Admitting: Podiatry

## 2021-01-29 ENCOUNTER — Ambulatory Visit (INDEPENDENT_AMBULATORY_CARE_PROVIDER_SITE_OTHER): Payer: Commercial Managed Care - PPO | Admitting: Podiatry

## 2021-01-29 DIAGNOSIS — I96 Gangrene, not elsewhere classified: Secondary | ICD-10-CM

## 2021-01-29 DIAGNOSIS — L97514 Non-pressure chronic ulcer of other part of right foot with necrosis of bone: Secondary | ICD-10-CM

## 2021-01-29 DIAGNOSIS — Z9889 Other specified postprocedural states: Secondary | ICD-10-CM

## 2021-01-29 NOTE — H&P (View-Only) (Signed)
  Subjective:  Patient ID: Benjamin Russo, male    DOB: 10/14/1976,  MRN: [id number]  Chief Complaint  Patient presents with  . Routine Post Op    I missed my surgery appointment I thought it was at midnight for the right foot    45 y.o. male presents with the above complaint. History confirmed with patient.   Objective:  Physical Exam: tenderness at the surgical site, local edema noted and calf supple, nontender. Incision: no dehiscence, no significant erythema, slow healing, slight clear drainage present.  There is residual small open area at the lateral aspect of the third toe. No exposed bone.  Assessment:   1. 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 Post-operative state   3. Gangrene of toe (HCC)     Plan:  Patient was evaluated and treated and all questions answered.  S/p Partial 4th/5th ray resection, 3rd metatarsal resection with residual OM -Wound is slow to heal, but overall improved compared to last visit.  -Betadine WTD applied today. -Discussed limiting weightbearing to promote healing. -Dispense surgical shoe for driving and back to work use. -Will reschedule debridement as I think he could still benefit from this to promote healing. -Planned procedures: Right foot debridement and irrigation, possible wound repair -Identified risk factors: DM, tobacco use.  No follow-ups on file.

## 2021-01-29 NOTE — Progress Notes (Signed)
Spoke w/ via phone for pre-op interview--- PT Lab needs dos----Istat and EKG               Lab results------ pt had CBC/ CMP done 01-11-2021 results in care everywhre COVID test ------ 01-30-2021 @ 1100 Arrive at ------- 1230 on 01-31-2021 NPO after MN NO Solid Food.  Clear liquids from MN until--- 1130 Medications to take morning of surgery ----- If gets Toprol refilled to take this am dos Diabetic medication ----- do note take metformin morning of surgery and night before surgery do half dose of lantus insulin Patient Special Instructions ----- n/a Pre-Op special Istructions -----  pt's pcp H&P is with chart /  This was rescheduled from 01-24-2021 cancelled because he ate.   Patient verbalized understanding of instructions that were given at this phone interview and had him to repeat back about only liquids until 1130 am and arrive 1230 pm. Patient denies shortness of breath, chest pain, fever, cough at this phone interview.

## 2021-01-29 NOTE — Progress Notes (Signed)
  Subjective:  Patient ID: Benjamin Russo, male    DOB: 05/10/1976,  MRN: [id number]  Chief Complaint  Patient presents with  . Routine Post Op    I missed my surgery appointment I thought it was at midnight for the right foot    44 y.o. male presents with the above complaint. History confirmed with patient.   Objective:  Physical Exam: tenderness at the surgical site, local edema noted and calf supple, nontender. Incision: no dehiscence, no significant erythema, slow healing, slight clear drainage present.  There is residual small open area at the lateral aspect of the third toe. No exposed bone.  Assessment:   1. 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 Post-operative state   3. Gangrene of toe (HCC)     Plan:  Patient was evaluated and treated and all questions answered.  S/p Partial 4th/5th ray resection, 3rd metatarsal resection with residual OM -Wound is slow to heal, but overall improved compared to last visit.  -Betadine WTD applied today. -Discussed limiting weightbearing to promote healing. -Dispense surgical shoe for driving and back to work use. -Will reschedule debridement as I think he could still benefit from this to promote healing. -Planned procedures: Right foot debridement and irrigation, possible wound repair -Identified risk factors: DM, tobacco use.  No follow-ups on file.  

## 2021-01-29 NOTE — Progress Notes (Signed)
  Subjective:  Patient ID: Benjamin Russo, male    DOB: Sep 05, 1976,  MRN: [id number]  Chief Complaint  Patient presents with  . Routine Post Op    I am doing ok on the right foot and  there is some draining and some odor and the nurse has not been out to see me    45 y.o. male presents with the above complaint. History confirmed with patient.   Objective:  Physical Exam: tenderness at the surgical site, local edema noted and calf supple, nontender. Incision: There is residualmall open area at the lateral aspect of the third toe, no warmth erythema signs of acute infection noted.  Mild serosanguineous drainage noted  Assessment:   1. 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 Post-operative state     Plan:  Patient was evaluated and treated and all questions answered.  S/p Partial 4th/5th ray resection, 3rd metatarsal resection with residual OM -Wound remains doing okay without acute signs of worsening. -Should wound worsen we would consider revising to TMA. -Continue antibiotics and antifungal per the direction of ID. -Dressed with Betadine wet-to-dry dressing today.  No follow-ups on file.

## 2021-01-30 ENCOUNTER — Other Ambulatory Visit (HOSPITAL_COMMUNITY)
Admission: RE | Admit: 2021-01-30 | Discharge: 2021-01-30 | Disposition: A | Discharge status: Home / Self Care | Payer: Commercial Managed Care - PPO | Source: Ambulatory Visit | Attending: Podiatry | Admitting: Podiatry

## 2021-01-30 DIAGNOSIS — Z20822 Contact with and (suspected) exposure to covid-19: Secondary | ICD-10-CM | POA: Diagnosis not present

## 2021-01-30 DIAGNOSIS — Z01812 Encounter for preprocedural laboratory examination: Secondary | ICD-10-CM | POA: Diagnosis not present

## 2021-01-30 LAB — SARS CORONAVIRUS 2 (TAT 6-24 HRS): SARS Coronavirus 2: NEGATIVE

## 2021-01-31 ENCOUNTER — Ambulatory Visit (HOSPITAL_BASED_OUTPATIENT_CLINIC_OR_DEPARTMENT_OTHER): Payer: Commercial Managed Care - PPO | Admitting: Anesthesiology

## 2021-01-31 ENCOUNTER — Ambulatory Visit (HOSPITAL_BASED_OUTPATIENT_CLINIC_OR_DEPARTMENT_OTHER)
Admission: RE | Admit: 2021-01-31 | Discharge: 2021-01-31 | Disposition: A | Discharge status: Home / Self Care | Payer: Commercial Managed Care - PPO | Attending: Podiatry | Admitting: Podiatry

## 2021-01-31 ENCOUNTER — Encounter (HOSPITAL_BASED_OUTPATIENT_CLINIC_OR_DEPARTMENT_OTHER)
Admission: RE | Disposition: A | Discharge status: Home / Self Care | Payer: Self-pay | Source: Home / Self Care | Attending: Podiatry

## 2021-01-31 ENCOUNTER — Encounter (HOSPITAL_BASED_OUTPATIENT_CLINIC_OR_DEPARTMENT_OTHER): Payer: Self-pay | Admitting: Podiatry

## 2021-01-31 ENCOUNTER — Other Ambulatory Visit: Payer: Self-pay

## 2021-01-31 DIAGNOSIS — L97529 Non-pressure chronic ulcer of other part of left foot with unspecified severity: Secondary | ICD-10-CM | POA: Insufficient documentation

## 2021-01-31 DIAGNOSIS — E11621 Type 2 diabetes mellitus with foot ulcer: Secondary | ICD-10-CM | POA: Insufficient documentation

## 2021-01-31 DIAGNOSIS — L97514 Non-pressure chronic ulcer of other part of right foot with necrosis of bone: Secondary | ICD-10-CM

## 2021-01-31 DIAGNOSIS — L97519 Non-pressure chronic ulcer of other part of right foot with unspecified severity: Secondary | ICD-10-CM | POA: Diagnosis not present

## 2021-01-31 HISTORY — PX: WOUND DEBRIDEMENT: SHX247

## 2021-01-31 LAB — POCT I-STAT, CHEM 8
BUN: 27 mg/dL — ABNORMAL HIGH (ref 6–20)
Calcium, Ion: 1.35 mmol/L (ref 1.15–1.40)
Chloride: 105 mmol/L (ref 98–111)
Creatinine, Ser: 1.2 mg/dL (ref 0.61–1.24)
Glucose, Bld: 187 mg/dL — ABNORMAL HIGH (ref 70–99)
HCT: 32 % — ABNORMAL LOW (ref 39.0–52.0)
Hemoglobin: 10.9 g/dL — ABNORMAL LOW (ref 13.0–17.0)
Potassium: 4.3 mmol/L (ref 3.5–5.1)
Sodium: 140 mmol/L (ref 135–145)
TCO2: 23 mmol/L (ref 22–32)

## 2021-01-31 LAB — GLUCOSE, CAPILLARY: Glucose-Capillary: 177 mg/dL — ABNORMAL HIGH (ref 70–99)

## 2021-01-31 SURGERY — DEBRIDEMENT, WOUND
Anesthesia: General | Site: Foot | Laterality: Right

## 2021-01-31 MED ORDER — CEPHALEXIN 500 MG PO CAPS: 500.0000 mg | ORAL_CAPSULE | Freq: Four times a day (QID) | ORAL | 0 refills | Status: AC

## 2021-01-31 MED ORDER — METOPROLOL TARTRATE 25 MG PO TABS
ORAL_TABLET | ORAL | Status: AC
  Filled 2021-01-31: qty 1

## 2021-01-31 MED ORDER — SODIUM CHLORIDE 0.9 % IR SOLN
Status: DC | PRN
  Administered 2021-01-31: 1000 mL

## 2021-01-31 MED ORDER — LACTATED RINGERS IV SOLN: INTRAVENOUS | Status: DC

## 2021-01-31 MED ORDER — ACETAMINOPHEN 500 MG PO TABS
ORAL_TABLET | ORAL | Status: AC
  Filled 2021-01-31: qty 2

## 2021-01-31 MED ORDER — MIDAZOLAM HCL 2 MG/2ML IJ SOLN
INTRAMUSCULAR | Status: DC | PRN
  Administered 2021-01-31: 1 mg via INTRAVENOUS

## 2021-01-31 MED ORDER — FENTANYL CITRATE (PF) 100 MCG/2ML IJ SOLN
INTRAMUSCULAR | Status: DC | PRN
  Administered 2021-01-31: 25 ug via INTRAVENOUS

## 2021-01-31 MED ORDER — VANCOMYCIN HCL 1000 MG IV SOLR
INTRAVENOUS | Status: AC
  Filled 2021-01-31: qty 1000

## 2021-01-31 MED ORDER — ACETAMINOPHEN 500 MG PO TABS
1000.0000 mg | ORAL_TABLET | Freq: Once | ORAL | Status: AC
  Administered 2021-01-31: 1000 mg via ORAL

## 2021-01-31 MED ORDER — PROPOFOL 10 MG/ML IV BOLUS
INTRAVENOUS | Status: DC | PRN
  Administered 2021-01-31: 30 mg via INTRAVENOUS

## 2021-01-31 MED ORDER — CEFAZOLIN SODIUM-DEXTROSE 2-4 GM/100ML-% IV SOLN
INTRAVENOUS | Status: AC
  Filled 2021-01-31: qty 100

## 2021-01-31 MED ORDER — BUPIVACAINE HCL (PF) 0.5 % IJ SOLN
INTRAMUSCULAR | Status: DC | PRN
  Administered 2021-01-31: 10 mL

## 2021-01-31 MED ORDER — PROPOFOL 500 MG/50ML IV EMUL
INTRAVENOUS | Status: DC | PRN
  Administered 2021-01-31: 75 ug/kg/min via INTRAVENOUS

## 2021-01-31 MED ORDER — BUPIVACAINE HCL (PF) 0.5 % IJ SOLN
INTRAMUSCULAR | Status: AC
  Filled 2021-01-31: qty 30

## 2021-01-31 MED ORDER — WHITE PETROLATUM EX OINT
TOPICAL_OINTMENT | CUTANEOUS | Status: AC
  Filled 2021-01-31: qty 5

## 2021-01-31 MED ORDER — ONDANSETRON HCL 4 MG/2ML IJ SOLN
INTRAMUSCULAR | Status: DC | PRN
  Administered 2021-01-31: 4 mg via INTRAVENOUS

## 2021-01-31 MED ORDER — MIDAZOLAM HCL 2 MG/2ML IJ SOLN
INTRAMUSCULAR | Status: AC
  Filled 2021-01-31: qty 2

## 2021-01-31 MED ORDER — PROMETHAZINE HCL 25 MG/ML IJ SOLN: 6.2500 mg | INTRAMUSCULAR | Status: DC | PRN

## 2021-01-31 MED ORDER — LIDOCAINE 2% (20 MG/ML) 5 ML SYRINGE
INTRAMUSCULAR | Status: DC | PRN
  Administered 2021-01-31: 20 mg via INTRAVENOUS

## 2021-01-31 MED ORDER — MEPERIDINE HCL 25 MG/ML IJ SOLN: 6.2500 mg | INTRAMUSCULAR | Status: DC | PRN

## 2021-01-31 MED ORDER — OXYCODONE HCL 5 MG/5ML PO SOLN: 5.0000 mg | Freq: Once | ORAL | Status: DC | PRN

## 2021-01-31 MED ORDER — HYDROMORPHONE HCL 1 MG/ML IJ SOLN: 0.2500 mg | INTRAMUSCULAR | Status: DC | PRN

## 2021-01-31 MED ORDER — 0.9 % SODIUM CHLORIDE (POUR BTL) OPTIME
TOPICAL | Status: DC | PRN
  Administered 2021-01-31: 1000 mL

## 2021-01-31 MED ORDER — FENTANYL CITRATE (PF) 100 MCG/2ML IJ SOLN
INTRAMUSCULAR | Status: AC
  Filled 2021-01-31: qty 2

## 2021-01-31 MED ORDER — LIDOCAINE HCL (PF) 2 % IJ SOLN
INTRAMUSCULAR | Status: AC
  Filled 2021-01-31: qty 5

## 2021-01-31 MED ORDER — CEFAZOLIN SODIUM-DEXTROSE 2-4 GM/100ML-% IV SOLN
2.0000 g | INTRAVENOUS | Status: AC
  Administered 2021-01-31: 2 g via INTRAVENOUS

## 2021-01-31 MED ORDER — OXYCODONE HCL 5 MG PO TABS: 5.0000 mg | ORAL_TABLET | Freq: Once | ORAL | Status: DC | PRN

## 2021-01-31 MED ORDER — METOPROLOL TARTRATE 25 MG PO TABS
25.0000 mg | ORAL_TABLET | Freq: Once | ORAL | Status: AC
  Administered 2021-01-31: 25 mg via ORAL

## 2021-01-31 MED ORDER — VANCOMYCIN HCL 1 G IV SOLR
INTRAVENOUS | Status: DC | PRN
  Administered 2021-01-31: 1000 mg

## 2021-01-31 SURGICAL SUPPLY — 58 items
APL PRP STRL LF DISP 70% ISPRP (MISCELLANEOUS)
BLADE SURG 15 STRL LF DISP TIS (BLADE) ×1 IMPLANT
BLADE SURG 15 STRL SS (BLADE) ×2
BNDG CMPR 9X4 STRL LF SNTH (GAUZE/BANDAGES/DRESSINGS)
BNDG ELASTIC 3X5.8 VLCR STR LF (GAUZE/BANDAGES/DRESSINGS) ×2 IMPLANT
BNDG ELASTIC 4X5.8 VLCR STR LF (GAUZE/BANDAGES/DRESSINGS) ×2 IMPLANT
BNDG ESMARK 4X9 LF (GAUZE/BANDAGES/DRESSINGS) IMPLANT
BNDG GAUZE ELAST 4 BULKY (GAUZE/BANDAGES/DRESSINGS) ×2 IMPLANT
CHLORAPREP W/TINT 26 (MISCELLANEOUS) IMPLANT
CNTNR URN SCR LID CUP LEK RST (MISCELLANEOUS) IMPLANT
CONT SPEC 4OZ STRL OR WHT (MISCELLANEOUS)
COVER BACK TABLE 60X90IN (DRAPES) ×2 IMPLANT
COVER WAND RF STERILE (DRAPES) ×2 IMPLANT
CUFF TOURN SGL QUICK 18X4 (TOURNIQUET CUFF) IMPLANT
CUFF TOURN SGL QUICK 24 (TOURNIQUET CUFF)
CUFF TRNQT CYL 24X4X16.5-23 (TOURNIQUET CUFF) IMPLANT
DRAPE 3/4 80X56 (DRAPES) ×2 IMPLANT
DRAPE EXTREMITY T 121X128X90 (DISPOSABLE) ×2 IMPLANT
DRAPE SHEET LG 3/4 BI-LAMINATE (DRAPES) ×2 IMPLANT
DRAPE U-SHAPE 47X51 STRL (DRAPES) ×2 IMPLANT
ELECT REM PT RETURN 9FT ADLT (ELECTROSURGICAL) ×2
ELECTRODE REM PT RTRN 9FT ADLT (ELECTROSURGICAL) ×1 IMPLANT
GAUZE SPONGE 4X4 12PLY STRL (GAUZE/BANDAGES/DRESSINGS) ×2 IMPLANT
GAUZE XEROFORM 1X8 LF (GAUZE/BANDAGES/DRESSINGS) ×2 IMPLANT
GLOVE BIO SURGEON STRL SZ7.5 (GLOVE) ×2 IMPLANT
GLOVE SRG 8 PF TXTR STRL LF DI (GLOVE) ×1 IMPLANT
GLOVE SURG UNDER POLY LF SZ8 (GLOVE) ×2
GOWN STRL REUS W/TWL LRG LVL3 (GOWN DISPOSABLE) ×2 IMPLANT
KIT TURNOVER CYSTO (KITS) ×2 IMPLANT
MANIFOLD NEPTUNE II (INSTRUMENTS) ×2 IMPLANT
MICROMATRIX 1000MG (Tissue) ×2 IMPLANT
NEEDLE HYPO 25X1 1.5 SAFETY (NEEDLE) ×2 IMPLANT
NS IRRIG 1000ML POUR BTL (IV SOLUTION) IMPLANT
PACK BASIN DAY SURGERY FS (CUSTOM PROCEDURE TRAY) ×2 IMPLANT
PADDING CAST ABS 4INX4YD NS (CAST SUPPLIES) ×1
PADDING CAST ABS COTTON 4X4 ST (CAST SUPPLIES) ×1 IMPLANT
PENCIL SMOKE EVAC W/HOLSTER (ELECTROSURGICAL) ×2 IMPLANT
PROBE DEBRIDE SONICVAC MISONIX (TIP) ×2 IMPLANT
SET IRRIG Y TYPE TUR BLADDER L (SET/KITS/TRAYS/PACK) IMPLANT
SOLUTION PARTIC MCRMTRX 1000MG (Tissue) ×1 IMPLANT
SPONGE LAP 4X18 RFD (DISPOSABLE) ×2 IMPLANT
STAPLER VISISTAT 35W (STAPLE) IMPLANT
STOCKINETTE 6  STRL (DRAPES) ×1
STOCKINETTE 6 STRL (DRAPES) ×1 IMPLANT
SUCTION FRAZIER HANDLE 10FR (MISCELLANEOUS)
SUCTION TUBE FRAZIER 10FR DISP (MISCELLANEOUS) IMPLANT
SUT ETHILON 3 0 PS 1 (SUTURE) ×4 IMPLANT
SUT ETHILON 4 0 PS 2 18 (SUTURE) IMPLANT
SUT MNCRL AB 3-0 PS2 18 (SUTURE) IMPLANT
SUT MNCRL AB 4-0 PS2 18 (SUTURE) IMPLANT
SUT VIC AB 2-0 SH 27 (SUTURE)
SUT VIC AB 2-0 SH 27XBRD (SUTURE) IMPLANT
SYR BULB EAR ULCER 3OZ GRN STR (SYRINGE) ×2 IMPLANT
SYR CONTROL 10ML LL (SYRINGE) ×2 IMPLANT
TRAY DSU PREP LF (CUSTOM PROCEDURE TRAY) ×2 IMPLANT
TUBE IRRIGATION SET MISONIX (TUBING) ×2 IMPLANT
UNDERPAD 30X36 HEAVY ABSORB (UNDERPADS AND DIAPERS) ×2 IMPLANT
YANKAUER SUCT BULB TIP NO VENT (SUCTIONS) ×2 IMPLANT

## 2021-01-31 NOTE — Anesthesia Procedure Notes (Signed)
Procedure Name: LMA Insertion Date/Time: 01/31/2021 3:13 PM Performed by: Francie Massing, CRNA Pre-anesthesia Checklist: Patient identified, Emergency Drugs available, Suction available and Patient being monitored Patient Re-evaluated:Patient Re-evaluated prior to induction Oxygen Delivery Method: Circle system utilized Preoxygenation: Pre-oxygenation with 100% oxygen Induction Type: IV induction Ventilation: Mask ventilation without difficulty LMA: LMA inserted LMA Size: 4.0 Number of attempts: 1 Airway Equipment and Method: Bite block Placement Confirmation: positive ETCO2 Tube secured with: Tape Dental Injury: Teeth and Oropharynx as per pre-operative assessment

## 2021-01-31 NOTE — Anesthesia Preprocedure Evaluation (Addendum)
Anesthesia Evaluation  Patient identified by MRN, date of birth, ID band Patient awake    Reviewed: Allergy & Precautions, NPO status , Patient's Chart, lab work & pertinent test results, reviewed documented beta blocker date and time   History of Anesthesia Complications Negative for: history of anesthetic complications  Airway Mallampati: II  TM Distance: >3 FB Neck ROM: Full    Dental no notable dental hx. (+) Dental Advisory Given, Poor Dentition   Pulmonary Patient abstained from smoking., former smoker,  Quit smoking 08/2020   Pulmonary exam normal        Cardiovascular hypertension, Pt. on home beta blockers and Pt. on medications Normal cardiovascular exam     Neuro/Psych negative neurological ROS  negative psych ROS   GI/Hepatic negative GI ROS, Neg liver ROS,   Endo/Other  diabetes, Poorly Controlled, Type 2, Insulin DependentLast a1c 9.1  Renal/GU negative Renal ROS  negative genitourinary   Musculoskeletal Diabetic ulcer L foot   Abdominal   Peds  Hematology negative hematology ROS (+)   Anesthesia Other Findings   Reproductive/Obstetrics negative OB ROS                            Anesthesia Physical Anesthesia Plan  ASA: III  Anesthesia Plan: General   Post-op Pain Management:    Induction: Intravenous  PONV Risk Score and Plan: 2 and Ondansetron, Dexamethasone, Midazolam and Treatment may vary due to age or medical condition  Airway Management Planned: LMA  Additional Equipment: None  Intra-op Plan:   Post-operative Plan: Extubation in OR  Informed Consent: I have reviewed the patients History and Physical, chart, labs and discussed the procedure including the risks, benefits and alternatives for the proposed anesthesia with the patient or authorized representative who has indicated his/her understanding and acceptance.     Dental advisory given  Plan  Discussed with: Anesthesiologist  Anesthesia Plan Comments:        Anesthesia Quick Evaluation

## 2021-01-31 NOTE — Brief Op Note (Signed)
01/31/2021  3:52 PM  PATIENT:  Benjamin Russo  45 y.o. male  PRE-OPERATIVE DIAGNOSIS:  DIABETIC ULCER LEFT FOOT  POST-OPERATIVE DIAGNOSIS:  DIABETIC ULCER LEFT FOOT  PROCEDURE:  Procedure(s): DEBRIDEMENT WOUND (Right)  SURGEON:  Surgeon(s) and Role:    * Park Liter, DPM - Primary  ASSISTANTS: none   ANESTHESIA:   local and general  EBL:  10 mL   BLOOD ADMINISTERED:none  DRAINS: none   LOCAL MEDICATIONS USED:  MARCAINE    and Amount: 10 ml  SPECIMEN:  nonr  DISPOSITION OF SPECIMEN:  n/a  COUNTS:  YES  TOURNIQUET:  * No tourniquets in log *  DICTATION: .Dragon Dictation  PLAN OF CARE: Discharge to home after PACU  PATIENT DISPOSITION:  PACU - hemodynamically stable.   Delay start of Pharmacological VTE agent (>24hrs) due to surgical blood loss or risk of bleeding: not applicable

## 2021-01-31 NOTE — Anesthesia Postprocedure Evaluation (Signed)
Anesthesia Post Note  Patient: Benjamin Russo  Procedure(s) Performed: DEBRIDEMENT WOUND (Right Foot)     Patient location during evaluation: PACU Anesthesia Type: General Level of consciousness: sedated Pain management: pain level controlled Vital Signs Assessment: post-procedure vital signs reviewed and stable Respiratory status: spontaneous breathing and respiratory function stable Cardiovascular status: stable Postop Assessment: no apparent nausea or vomiting Anesthetic complications: no   No complications documented.  Last Vitals:  Vitals:   01/31/21 1630 01/31/21 1646  BP: 135/79 133/76  Pulse: 74 75  Resp: 17 15  Temp:  (!) 36.3 C  SpO2: 99% 99%    Last Pain:  Vitals:   01/31/21 1646  TempSrc:   PainSc: 0-No pain                 Gordie Crumby DANIEL

## 2021-01-31 NOTE — Interval H&P Note (Signed)
History and Physical Interval Note:  01/31/2021 2:48 PM  Benjamin Russo  has presented today for surgery, with the diagnosis of DIABETIC ULCER LEFT FOOT.  The various methods of treatment have been discussed with the patient and family. After consideration of risks, benefits and other options for treatment, the patient has consented to  Procedure(s): DEBRIDEMENT WOUND AND APPLICATION OF SKIN GRAFT (Left) as a surgical intervention.  The patient's history has been reviewed, patient examined, no change in status, stable for surgery.  I have reviewed the patient's chart and labs.  Questions were answered to the patient's satisfaction.     Park Liter

## 2021-01-31 NOTE — Transfer of Care (Signed)
Immediate Anesthesia Transfer of Care Note  Patient: Benjamin Russo  Procedure(s) Performed: Procedure(s) (LRB): DEBRIDEMENT WOUND (Right)  Patient Location: PACU  Anesthesia Type: General  Level of Consciousness: awake, oriented, sedated and patient cooperative  Airway & Oxygen Therapy: Patient Spontanous Breathing and Patient connected to face mask oxygen  Post-op Assessment: Report given to PACU RN and Post -op Vital signs reviewed and stable  Post vital signs: Reviewed and stable  Complications: No apparent anesthesia complications  Last Vitals:  Vitals Value Taken Time  BP    Temp    Pulse 80 01/31/21 1553  Resp 16 01/31/21 1553  SpO2 100 % 01/31/21 1553  Vitals shown include unvalidated device data.  Last Pain:  Vitals:   01/31/21 1331  TempSrc: Oral  PainSc: 0-No pain      Patients Stated Pain Goal: 3 (01/31/21 1331)  Complications: No complications documented.

## 2021-01-31 NOTE — Discharge Instructions (Signed)
  After Surgery Instructions   1) If you are recuperating from surgery anywhere other than home, please be sure to leave us the number where you can be reached.  2) Go directly home and rest.  3) Keep the operated foot(feet) elevated six inches above the hip when sitting or lying down. This will help control swelling and pain.  4) Support the elevated foot and leg with pillows. DO NOT PLACE PILLOWS UNDER THE KNEE.  5) DO NOT REMOVE or get your bandages WET, unless you were given different instructions by your doctor to do so. This increases the risk of infection.  6) Wear your surgical shoe or surgical boot at all times when you are up on your feet.  7) A limited amount of pain and swelling may occur. The skin may take on a bruised appearance. DO NOT BE ALARMED, THIS IS NORMAL.  8) For slight pain and swelling, apply an ice pack directly over the bandages for 15 minutes only out of each hour of the day. Continue until seen in the office for your first post op visit. DO NOT APPLY ANY FORM OF HEAT TO THE AREA.  9) Have prescriptions filled immediately and take as directed.  10) Drink lots of liquids, water and juice to stay hydrated.  11) CALL IMMEDIATELY IF:  *Bleeding continues until the following day of surgery  *Pain increases and/or does not respond to medication  *Bandages or cast appears to tight  *If your bandage gets wet  *Trip, fall or stump your surgical foot  *If your temperature goes above 101  *If you have ANY questions at all  12) You are expected to be weightbearing after your surgery.   If you need to reach the nurse for any reason, please call: [place]: [phone] [place]: [phone] [place]: [phone]   Post Anesthesia Home Care Instructions  Activity: Get plenty of rest for the remainder of the day. A responsible individual must stay with you for 24 hours following the procedure.  For the next 24 hours, DO NOT: -Drive a  car -Operate machinery -Drink alcoholic beverages -Take any medication unless instructed by your physician -Make any legal decisions or sign important papers.  Meals: Start with liquid foods such as gelatin or soup. Progress to regular foods as tolerated. Avoid greasy, spicy, heavy foods. If nausea and/or vomiting occur, drink only clear liquids until the nausea and/or vomiting subsides. Call your physician if vomiting continues.  Special Instructions/Symptoms: Your throat may feel dry or sore from the anesthesia or the breathing tube placed in your throat during surgery. If this causes discomfort, gargle with warm salt water. The discomfort should disappear within 24 hours.  If you had a scopolamine patch placed behind your ear for the management of post- operative nausea and/or vomiting:  1. The medication in the patch is effective for 72 hours, after which it should be removed.  Wrap patch in a tissue and discard in the trash. Wash hands thoroughly with soap and water. 2. You may remove the patch earlier than 72 hours if you experience unpleasant side effects which may include dry mouth, dizziness or visual disturbances. 3. Avoid touching the patch. Wash your hands with soap and water after contact with the patch.      

## 2021-02-01 ENCOUNTER — Encounter (HOSPITAL_BASED_OUTPATIENT_CLINIC_OR_DEPARTMENT_OTHER): Payer: Self-pay | Admitting: Podiatry

## 2021-02-05 ENCOUNTER — Encounter: Payer: Commercial Managed Care - PPO | Admitting: Podiatry

## 2021-02-05 ENCOUNTER — Telehealth: Payer: Self-pay | Admitting: Podiatry

## 2021-02-05 NOTE — Telephone Encounter (Signed)
Pt is requesting a note to return to work asap.  If ok to return what restrictions will he need.

## 2021-02-07 ENCOUNTER — Encounter: Payer: Self-pay | Admitting: Podiatry

## 2021-02-07 NOTE — Telephone Encounter (Signed)
He is ok to return to work - he must use his boot or shoe at work

## 2021-02-08 ENCOUNTER — Encounter: Payer: Self-pay | Admitting: Podiatry

## 2021-02-08 ENCOUNTER — Other Ambulatory Visit: Payer: Self-pay

## 2021-02-08 ENCOUNTER — Ambulatory Visit (INDEPENDENT_AMBULATORY_CARE_PROVIDER_SITE_OTHER): Payer: Commercial Managed Care - PPO | Admitting: Podiatry

## 2021-02-08 DIAGNOSIS — Z9889 Other specified postprocedural states: Secondary | ICD-10-CM

## 2021-02-08 DIAGNOSIS — L97514 Non-pressure chronic ulcer of other part of right foot with necrosis of bone: Secondary | ICD-10-CM

## 2021-02-15 ENCOUNTER — Ambulatory Visit (INDEPENDENT_AMBULATORY_CARE_PROVIDER_SITE_OTHER): Payer: Commercial Managed Care - PPO | Admitting: Podiatry

## 2021-02-15 ENCOUNTER — Encounter: Payer: Self-pay | Admitting: Sports Medicine

## 2021-02-15 ENCOUNTER — Other Ambulatory Visit: Payer: Self-pay

## 2021-02-15 ENCOUNTER — Encounter: Payer: Commercial Managed Care - PPO | Admitting: Podiatry

## 2021-02-15 DIAGNOSIS — E11621 Type 2 diabetes mellitus with foot ulcer: Secondary | ICD-10-CM

## 2021-02-15 DIAGNOSIS — L97514 Non-pressure chronic ulcer of other part of right foot with necrosis of bone: Secondary | ICD-10-CM | POA: Diagnosis not present

## 2021-02-15 DIAGNOSIS — L97413 Non-pressure chronic ulcer of right heel and midfoot with necrosis of muscle: Secondary | ICD-10-CM

## 2021-02-15 DIAGNOSIS — Z9889 Other specified postprocedural states: Secondary | ICD-10-CM

## 2021-02-15 NOTE — Progress Notes (Signed)
  Subjective:  Patient ID: Benjamin Russo, male    DOB: 1976-02-14,  MRN: [id number]  Chief Complaint  Patient presents with  . Routine Post Op    POV -pt deneis N/V/F/?Ch -per pt with more bloody drainage -no redness/swelling tx; iodine dressing every 2 days, and cam wlaker boot   45 y.o. male presents with the above complaint. History confirmed with patient.   Objective:  Physical Exam: tenderness at the surgical site, local edema noted and calf supple, nontender. Incision: Continued dehiscence laterally measuring 1x0.6x1 post-debridement.  Assessment:   1. 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 Post-operative state    Plan:  Patient was evaluated and treated and all questions answered.  S/p Partial 4th/5th ray resection, 3rd metatarsal resection with residual OM -Wound is slow to heal. Similar in size to previous. Will look into getting a wound graft covered to promote healing. -Debrided today -Re-ordered wound supplies for patient.  Procedure: Selective Debridement of Wound Rationale: Removal of devitalized tissue from the wound to promote healing.  Pre-Debridement Wound Measurements: 1 cm x 0.6 cm x 1 cm  Post-Debridement Wound Measurements: same as pre-debridement. Type of Debridement: sharp selective Instrumentation: dermal curette, tissue nipper Tissue Removed: Devitalized soft-tissue Dressing: Dry, sterile, compression dressing. Disposition: Patient tolerated procedure well.  No follow-ups on file.

## 2021-02-16 ENCOUNTER — Telehealth: Payer: Self-pay | Admitting: *Deleted

## 2021-02-16 NOTE — Telephone Encounter (Signed)
Called and spoke with the patient and stated that I called and spoke with a representative from [employer] and they stated that the patient has a deductible of $502.00 and that would have to be paid and then he could get the supplies and patient stated that he would just wait. Misty Stanley

## 2021-02-19 ENCOUNTER — Encounter: Payer: Commercial Managed Care - PPO | Admitting: Podiatry

## 2021-02-21 ENCOUNTER — Ambulatory Visit: Payer: Commercial Managed Care - PPO | Admitting: Infectious Disease

## 2021-02-26 ENCOUNTER — Ambulatory Visit (INDEPENDENT_AMBULATORY_CARE_PROVIDER_SITE_OTHER): Payer: Commercial Managed Care - PPO | Admitting: Podiatry

## 2021-02-26 ENCOUNTER — Encounter: Payer: Self-pay | Admitting: Podiatry

## 2021-02-26 ENCOUNTER — Other Ambulatory Visit: Payer: Self-pay

## 2021-02-26 DIAGNOSIS — L97514 Non-pressure chronic ulcer of other part of right foot with necrosis of bone: Secondary | ICD-10-CM

## 2021-02-26 NOTE — Progress Notes (Signed)
  Subjective:  Patient ID: Benjamin Russo, male    DOB: December 15, 1976,  MRN: [id number]  Chief Complaint  Patient presents with  . Routine Post Op    POV#2 -pt denies N/V/F/Ch -per pt it feels much better, no pain at sx site, but with discomfort at ball of foot -no redness/swelling -w/ yellow drainage (less) -no odor tx: betadine and dressing every second day   . Diabetes    FBS: 178 a1C: 8   45 y.o. male presents with the above complaint. History confirmed with patient. Could not afford wound supplies.  Objective:  Physical Exam: tenderness at the surgical site, local edema noted and calf supple, nontender. Incision: Continued dehiscence laterally measuring 1x0.6x1 post-debridement, partial delay of the wound closure making the wound overall 3x0.5. Plantar IPJ ulcer with exposed tendon.  Assessment:   1. 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  S/p Partial 4th/5th ray resection, 3rd metatarsal resection with residual OM -Wound is slow to heal. Similar in size to previous. Will look into getting a wound graft covered to promote healing. -Debrided today. Packed with [employer]  -Dispensed wound supplies to patient, he is unable to afford the supplies previously ordered. -Should wound continue to be slow to heal plan for repeat debridement.  Procedure: Selective Debridement of Wound Rationale: Removal of devitalized tissue from the wound to promote healing.  Pre-Debridement Wound Measurements: 1 cm x 0.6 cm x 0.3 cm  Post-Debridement Wound Measurements: same as pre-debridement. Type of Debridement: sharp selective Instrumentation: dermal curette, tissue nipper Tissue Removed: Devitalized soft-tissue Dressing: Dry, sterile, compression dressing. Disposition: Patient tolerated procedure well.    No follow-ups on file.

## 2021-03-01 NOTE — Op Note (Signed)
Patient Name: Benjamin Russo DOB: [birth year]  MRN: [id number]   Date of Service: 01/31/21   Surgeon: Dr. Hardie Pulley, DPM Assistants: None Pre-operative Diagnosis: Ulcer left foot Post-operative Diagnosis: same Procedures:             1) Debridement and closure of left foot wound. Pathology/Specimens: * No specimens in log * Anesthesia: MAC Hemostasis: Anatomic Estimated Blood Loss: 14m Materials:  Implant Name Type Inv. Item Serial No. Manufacturer Lot No. LRB No. Used Action  MICROMATRIX 1000MG - [id number] MICROMATRIX 1000MG  [id number] Implanted   Medications: Vancomycin powder, topical. Complications: None  Indications for Procedure:  This is a 45y.o. male with a chronic left foot wound. He presents today for debridement and attempted closure.   Procedure in Detail: Patient was identified in pre-operative holding area. Formal consent was signed and the left lower extremity was marked. Patient was brought back to the operating room and placed on the operating room table in the supine position. Anesthesia was induced.   The extremity was prepped and draped in the usual sterile fashion. Timeout was taken to confirm patient name, laterality, and procedure prior to incision. Attention was then directed to the left foot where a wound measuring 1.5x1 was encountered.  The wound was sharply excisionally debrided with a 15 blade, followed by a misonix ultrasonic debrider. Debridement was performed to bleeding, viable wound base. The wound was debrided to the level of the muscle tissue. Following debridement the wound measured 2x1. Topical vancomycin and micromatrix powder were applied. The wound was loosely re-approximated with nylon and skin staples.  The foot was then dressed with 4x4, kerlix, and ACE bandage. Patient tolerated the procedure well.   Disposition: Following a period of post-operative monitoring, patient will be transferred back home.

## 2021-03-01 NOTE — Progress Notes (Signed)
  Subjective:  Patient ID: Doniven Vanpatten, male    DOB: Jul 06, 1976,  MRN: [id number]  Chief Complaint  Patient presents with  . Routine Post Op    I had surgery on the right foot and I am doing ok    45 y.o. male presents with the above complaint. History confirmed with patient.   Objective:  Physical Exam: no tenderness at the surgical site.  Incision: Partial dehiscence noted, mild ss drainage, no warmth, erythema, signs of infection noted.  No images are attached to the encounter.  Assessment:   1. 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 Post-operative state    Plan:  Patient was evaluated and treated and all questions answered.  Post-operative State -Will continue to monitor wound progress -Dressing applied consisting of sterile gauze, kerlix and ACE bandage -WBAT in Surgical shoe  No follow-ups on file.

## 2021-03-05 ENCOUNTER — Other Ambulatory Visit: Payer: Self-pay

## 2021-03-05 ENCOUNTER — Encounter: Payer: Self-pay | Admitting: Podiatry

## 2021-03-05 ENCOUNTER — Ambulatory Visit (INDEPENDENT_AMBULATORY_CARE_PROVIDER_SITE_OTHER): Payer: Commercial Managed Care - PPO | Admitting: Podiatry

## 2021-03-05 DIAGNOSIS — L97514 Non-pressure chronic ulcer of other part of right foot with necrosis of bone: Secondary | ICD-10-CM

## 2021-03-05 NOTE — Progress Notes (Signed)
  Subjective:  Patient ID: Benjamin Russo, male    DOB: 1976-02-12,  MRN: [id number]  Chief Complaint  Patient presents with  . Routine Post Op    POV#3 -pt denies N/V/F/Ch -= pt states w more foot sensation and foot feels much better -w/ discomfort when boot is off otherwise doing fine Tx: betadine dressing, boot and abx   . Diabetes    FBS: 165 A1C: 8   45 y.o. male presents with the above complaint. History confirmed with patient.   Objective:  Physical Exam: tenderness at the surgical site, local edema noted and calf supple, nontender. Incision: Continued dehiscence laterally measuring 1x0.4 partial delay of the wound closure making the wound overall 3x0.5. Plantar IPJ ulcer healing well no exposed tendon noted.  Assessment:   1. 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  S/p Partial 4th/5th ray resection, 3rd metatarsal resection with residual OM -Slightly improved c/t prior. Patient states the foot is feeling better. -No debridement today. Dressed with betadine WTD -Should wound continue to be slow to heal plan for repeat debridement.  No follow-ups on file.

## 2021-03-19 ENCOUNTER — Other Ambulatory Visit: Payer: Self-pay

## 2021-03-19 ENCOUNTER — Ambulatory Visit (INDEPENDENT_AMBULATORY_CARE_PROVIDER_SITE_OTHER): Payer: Commercial Managed Care - PPO | Admitting: Podiatry

## 2021-03-19 ENCOUNTER — Encounter: Payer: Self-pay | Admitting: Podiatry

## 2021-03-19 ENCOUNTER — Encounter: Payer: Commercial Managed Care - PPO | Admitting: Podiatry

## 2021-03-19 DIAGNOSIS — Z9889 Other specified postprocedural states: Secondary | ICD-10-CM

## 2021-03-19 DIAGNOSIS — L97514 Non-pressure chronic ulcer of other part of right foot with necrosis of bone: Secondary | ICD-10-CM

## 2021-03-29 NOTE — Progress Notes (Signed)
  Subjective:  Patient ID: Benjamin Russo, male    DOB: 1976/06/04,  MRN: [id number]  Chief Complaint  Patient presents with  . Routine Post Op    I am doing ok and I have my tennis shoe on    45 y.o. male presents with the above complaint. History confirmed with patient.   Objective:  Physical Exam: tenderness at the surgical site, local edema noted and calf supple, nontender. Incision: Continued dehiscence laterally measuring 2x0.3 in total. Plantar IPJ ulcer healing well no exposed tendon noted.  Assessment:   1. 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 Post-operative state    Plan:  Patient was evaluated and treated and all questions answered.  S/p Partial 4th/5th ray resection, 3rd metatarsal resection with residual OM -Continues to improve slowly.  We did discuss keeping his surgical shoe on until he is fully healed and we can get a DM insert.  I am concerned that with normal shoe gear he will have pressure underneath the remaining metatarsal heads. -The wound was copiously irrigated dressed with Betadine wet-to-dry.  Patient to dress every other day with Betadine and dry sterile dressing.  No follow-ups on file.

## 2021-06-11 ENCOUNTER — Ambulatory Visit: Payer: Commercial Managed Care - PPO | Admitting: Podiatry

## 2021-08-03 LAB — ANTIFUNGAL AST 9 DRUG PANEL
Amphotericin B MIC: 0.5
Fluconazole Islt MIC: 1
Flucytosine MIC: 0.12
Itraconazole MIC: 0.12
Posaconazole MIC: 0.06

## 2022-08-16 ENCOUNTER — Encounter (HOSPITAL_COMMUNITY): Payer: Self-pay

## 2022-09-11 ENCOUNTER — Other Ambulatory Visit: Payer: Self-pay
